# Patient Record
Sex: Female | Born: 1950 | Race: White | Hispanic: No | Smoking: Never smoker
Health system: Southern US, Community
[De-identification: ages and names within clinical notes are randomized; demographics above are authoritative.]

## PROBLEM LIST (undated history)

## (undated) DIAGNOSIS — M1990 Unspecified osteoarthritis, unspecified site: Secondary | ICD-10-CM

## (undated) DIAGNOSIS — I10 Essential (primary) hypertension: Secondary | ICD-10-CM

## (undated) DIAGNOSIS — K760 Fatty (change of) liver, not elsewhere classified: Secondary | ICD-10-CM

## (undated) DIAGNOSIS — R112 Nausea with vomiting, unspecified: Secondary | ICD-10-CM

## (undated) DIAGNOSIS — Z9889 Other specified postprocedural states: Secondary | ICD-10-CM

## (undated) DIAGNOSIS — E039 Hypothyroidism, unspecified: Secondary | ICD-10-CM

## (undated) DIAGNOSIS — R000 Tachycardia, unspecified: Secondary | ICD-10-CM

## (undated) DIAGNOSIS — E785 Hyperlipidemia, unspecified: Secondary | ICD-10-CM

## (undated) HISTORY — DX: Essential (primary) hypertension: I10

## (undated) HISTORY — DX: Fatty (change of) liver, not elsewhere classified: K76.0

## (undated) HISTORY — PX: ABDOMINAL HYSTERECTOMY: SHX81

## (undated) HISTORY — DX: Hyperlipidemia, unspecified: E78.5

## (undated) HISTORY — DX: Hypothyroidism, unspecified: E03.9

## (undated) HISTORY — PX: TUBAL LIGATION: SHX77

## (undated) HISTORY — DX: Tachycardia, unspecified: R00.0

## (undated) HISTORY — PX: KNEE ARTHROSCOPY: SUR90

## (undated) SURGERY — Surgical Case
Anesthesia: *Unknown

---

## 2004-10-23 ENCOUNTER — Ambulatory Visit: Payer: Self-pay | Admitting: Family Medicine

## 2005-10-29 ENCOUNTER — Ambulatory Visit: Payer: Self-pay | Admitting: Family Medicine

## 2006-11-04 ENCOUNTER — Ambulatory Visit: Payer: Self-pay | Admitting: Family Medicine

## 2007-11-10 ENCOUNTER — Ambulatory Visit: Payer: Self-pay | Admitting: Family Medicine

## 2008-11-10 ENCOUNTER — Ambulatory Visit: Payer: Self-pay | Admitting: Family Medicine

## 2009-12-05 ENCOUNTER — Ambulatory Visit: Payer: Self-pay | Admitting: Family Medicine

## 2010-12-16 ENCOUNTER — Ambulatory Visit: Payer: Self-pay | Admitting: Family Medicine

## 2012-03-15 ENCOUNTER — Ambulatory Visit: Payer: Self-pay | Admitting: Family Medicine

## 2013-04-19 ENCOUNTER — Ambulatory Visit: Payer: Self-pay | Admitting: Family Medicine

## 2013-05-17 ENCOUNTER — Ambulatory Visit: Payer: Self-pay | Admitting: Family Medicine

## 2013-05-31 ENCOUNTER — Ambulatory Visit: Payer: Self-pay | Admitting: General Surgery

## 2013-06-08 ENCOUNTER — Ambulatory Visit (INDEPENDENT_AMBULATORY_CARE_PROVIDER_SITE_OTHER): Payer: BC Managed Care – PPO | Admitting: General Surgery

## 2013-06-08 ENCOUNTER — Other Ambulatory Visit: Payer: BC Managed Care – PPO

## 2013-06-08 ENCOUNTER — Encounter: Payer: Self-pay | Admitting: General Surgery

## 2013-06-08 VITALS — BP 130/78 | HR 76 | Resp 12 | Ht 62.0 in | Wt 174.0 lb

## 2013-06-08 DIAGNOSIS — N63 Unspecified lump in unspecified breast: Secondary | ICD-10-CM

## 2013-06-08 HISTORY — PX: BREAST BIOPSY: SHX20

## 2013-06-08 NOTE — Patient Instructions (Signed)

## 2013-06-08 NOTE — Progress Notes (Signed)
Patient ID: Samantha SearingKaren P Sacco, female   DOB: 09/29/1950, 63 y.o.   MRN: [id number]  Chief Complaint  Patient presents with  . Breast Problem    abnormal mammogram    HPI Samantha Hamilton is a 63 y.o. female who presents for a breast evaluation. The most recent mammogram was done on 05/17/13. Patient does perform regular self breast checks and gets regular mammograms done.    HPI  Past Medical History  Diagnosis Date  . Hypertension   . Heart rate fast     Past Surgical History  Procedure Laterality Date  . Abdominal hysterectomy      History reviewed. No pertinent family history.  Social History History  Substance Use Topics  . Smoking status: Never Smoker   . Smokeless tobacco: Never Used  . Alcohol Use: No    No Known Allergies  Current Outpatient Prescriptions  Medication Sig Dispense Refill  . amLODipine-benazepril (LOTREL) 10-20 MG per capsule Take 1 capsule by mouth daily.      [name]. atenolol (TENORMIN) 100 MG tablet Take 100 mg by mouth daily.       No current facility-administered medications for this visit.    Review of Systems Review of Systems  Constitutional: Negative.   Respiratory: Negative.   Cardiovascular: Negative.     Blood pressure 130/78, pulse 76, resp. rate 12, height 5\' 2"  (1.575 m), weight 174 lb (78.926 kg).  Physical Exam Physical Exam  Constitutional: She is oriented to person, place, and time. She appears well-developed and well-nourished.  Eyes: Conjunctivae are normal.  Cardiovascular: Normal rate, regular rhythm and normal heart sounds.   Pulmonary/Chest: Breath sounds normal. Right breast exhibits no inverted nipple, no mass, no nipple discharge, no skin change and no tenderness. Left breast exhibits no inverted nipple, no mass, no nipple discharge, no skin change and no tenderness.  Abdominal: Soft. Bowel sounds are normal. There is no tenderness.  Lymphadenopathy:    She has no cervical adenopathy.    She has no axillary  adenopathy.  Neurological: She is alert and oriented to person, place, and time.  Skin: Skin is warm and dry.    Data Reviewed Mammogram and US  Reviewed.  Assessment    Small benign appearing mass in right breast 3ocll subareolar. Pt is inclined to have it biopsied. Core biopsy explained to her.    Plan    Core biopsy completed today. If benign will recheck in 4 mos with US.       Alaa Eyerman G Ledon Weihe 06/08/2013, 1:11 PM

## 2013-06-13 LAB — PATHOLOGY

## 2013-06-14 ENCOUNTER — Telehealth: Payer: Self-pay | Admitting: *Deleted

## 2013-06-14 NOTE — Telephone Encounter (Signed)
Notified patient as instructed, patient pleased. Discussed follow-up appointments, patient agrees. Placed in recalls.  

## 2013-06-14 NOTE — Telephone Encounter (Signed)
Pt called and just wanted to know what her results are from her BX that she had done on 06/08/13

## 2013-06-14 NOTE — Telephone Encounter (Signed)
Message copied by Currie ParisHATCH, Lashika Erker M on Tue Jun 14, 2013  1:20 PM ------      Message from: Kieth BrightlySANKAR, SEEPLAPUTHUR G      Created: Tue Jun 14, 2013 10:51 AM       Please let pt pt know the pathology was normal. F/U in 4 mos as discussed ------

## 2013-10-17 ENCOUNTER — Ambulatory Visit: Payer: Self-pay | Admitting: General Surgery

## 2013-10-17 ENCOUNTER — Other Ambulatory Visit: Payer: No Typology Code available for payment source

## 2013-10-17 ENCOUNTER — Encounter: Payer: Self-pay | Admitting: General Surgery

## 2013-10-17 ENCOUNTER — Ambulatory Visit (INDEPENDENT_AMBULATORY_CARE_PROVIDER_SITE_OTHER): Payer: Medicare Other | Admitting: General Surgery

## 2013-10-17 VITALS — BP 120/72 | HR 72 | Resp 14 | Ht 62.0 in | Wt 174.0 lb

## 2013-10-17 DIAGNOSIS — N6310 Unspecified lump in the right breast, unspecified quadrant: Secondary | ICD-10-CM

## 2013-10-17 DIAGNOSIS — N63 Unspecified lump in unspecified breast: Secondary | ICD-10-CM

## 2013-10-17 NOTE — Progress Notes (Signed)
Patient ID: [name], female   DOB: August 10, 1950, 63 y.o.   MRN: [id number]  Chief Complaint  Patient presents with  . Follow-up    rigth breast ultrasound    HPI Samantha Hamilton is a 63 y.o. female here today following up from a right breast core biopsy done 06/08/13. Patient states she is doing well with no new problems.  HPI  Past Medical History  Diagnosis Date  . Hypertension   . Heart rate fast     Past Surgical History  Procedure Laterality Date  . Abdominal hysterectomy    . Breast biopsy Right 06/08/13    History reviewed. No pertinent family history.  Social History History  Substance Use Topics  . Smoking status: Never Smoker   . Smokeless tobacco: Never Used  . Alcohol Use: No    No Known Allergies  Current Outpatient Prescriptions  Medication Sig Dispense Refill  . amLODipine-benazepril (LOTREL) 10-20 MG per capsule Take 1 capsule by mouth daily.      [name] atenolol (TENORMIN) 100 MG tablet Take 100 mg by mouth daily.      . Biotin (SUPER BIOTIN) 5 MG TABS Take by mouth.      . diclofenac (VOLTAREN) 75 MG EC tablet Take by mouth.       No current facility-administered medications for this visit.    Review of Systems Review of Systems  Constitutional: Negative.   Respiratory: Negative.   Cardiovascular: Negative.     Blood pressure 120/72, pulse 72, resp. rate 14, height  (1.575 m), weight 174 lb (78.926 kg).  Physical Exam Physical Exam  Constitutional: She is oriented to person, place, and time. She appears well-developed and well-nourished.  Pulmonary/Chest: Right breast exhibits no inverted nipple, no mass, no nipple discharge, no skin change and no tenderness.  Neurological: She is alert and oriented to person, place, and time.  Skin: Skin is warm and dry.    Data Reviewed Path-showed apocrine cysts  Assessment    Right breat ultrasound performed- few tiny cysts and some mildly dilated ducts at site of prio  biopsy medial to nipple       Plan    Patient to return in four months with bilateral screening mammogram         Samantha Hamilton 10/18/2013, 5:08 PM

## 2013-10-17 NOTE — Patient Instructions (Addendum)
Patient to return in four months with bilateral screening mammogram . Continue self breast exams. Call office for any new breast issues or concerns.

## 2013-10-18 ENCOUNTER — Encounter: Payer: Self-pay | Admitting: General Surgery

## 2013-10-27 ENCOUNTER — Ambulatory Visit: Payer: Self-pay | Admitting: Orthopedic Surgery

## 2013-12-05 ENCOUNTER — Encounter: Payer: Self-pay | Admitting: General Surgery

## 2013-12-09 ENCOUNTER — Ambulatory Visit: Payer: Self-pay | Admitting: Unknown Physician Specialty

## 2013-12-23 ENCOUNTER — Ambulatory Visit: Payer: Self-pay | Admitting: Unknown Physician Specialty

## 2014-03-22 ENCOUNTER — Telehealth: Payer: Self-pay

## 2014-03-22 NOTE — Telephone Encounter (Signed)
LMOM for a return call.  

## 2014-03-22 NOTE — Telephone Encounter (Signed)
Pt called to set up her first tcs. She states she is not having any GI problems and is new to the area and we were recommended to her. Please call her at [phone]

## 2014-03-23 ENCOUNTER — Telehealth: Payer: Self-pay

## 2014-03-23 ENCOUNTER — Other Ambulatory Visit: Payer: Self-pay

## 2014-03-23 DIAGNOSIS — Z1211 Encounter for screening for malignant neoplasm of colon: Secondary | ICD-10-CM

## 2014-03-23 NOTE — Telephone Encounter (Signed)
Gastroenterology Pre-Procedure Review  Request Date: 03/23/2014 Requesting Physician:   PATIENT REVIEW QUESTIONS: The patient responded to the following health history questions as indicated:    This is first colonoscopy for pt. [facility]/ Recently has insurance  1. Diabetes Melitis: no 2. Joint replacements in the past 12 months: no 3. Major health problems in the past 3 months: no 4. Has an artificial valve or MVP: no 5. Has a defibrillator: no 6. Has been advised in past to take antibiotics in advance of a procedure like teeth cleaning: no    MEDICATIONS & ALLERGIES:    Patient reports the following regarding taking any blood thinners:   Plavix? no Aspirin? no Coumadin? no  Patient confirms/reports the following medications:  Current Outpatient Prescriptions  Medication Sig Dispense Refill  . amLODipine-benazepril (LOTREL) 10-20 MG per capsule Take 1 capsule by mouth daily.    [name]. atenolol (TENORMIN) 100 MG tablet Take 100 mg by mouth daily.    . Biotin (SUPER BIOTIN) 5 MG TABS Take by mouth.    . estradiol (ESTRACE) 0.5 MG tablet Take 0.5 mg by mouth daily.     No current facility-administered medications for this visit.    Patient confirms/reports the following allergies:  No Known Allergies  No orders of the defined types were placed in this encounter.    AUTHORIZATION INFORMATION Primary Insurance:   ID #:   Group #:  Pre-Cert / Auth required:  Pre-Cert / Auth #:   Secondary Insurance:   ID #:   Group #:  Pre-Cert / Auth required:  Pre-Cert / Auth #:   SCHEDULE INFORMATION: Procedure has been scheduled as follows:  Date:   04/14/2014            Time:  11:30 AM Location: [facility] Short Stay  This Gastroenterology Pre-Precedure Review Form is being routed to the following provider(s): R. Roetta SessionsMichael Rourk, MD

## 2014-03-23 NOTE — Telephone Encounter (Signed)
See separate triage.  

## 2014-03-27 NOTE — Telephone Encounter (Signed)
Appropriate.

## 2014-03-29 ENCOUNTER — Telehealth: Payer: Self-pay

## 2014-03-29 MED ORDER — PEG-KCL-NACL-NASULF-NA ASC-C 100 G PO SOLR: 1.0000 | ORAL | Status: DC

## 2014-03-29 NOTE — Telephone Encounter (Signed)
See separate triage.  

## 2014-03-29 NOTE — Telephone Encounter (Signed)
Rx sent to the pharmacy and instructions mailed to pt.  

## 2014-04-10 ENCOUNTER — Telehealth: Payer: Self-pay

## 2014-04-10 NOTE — Telephone Encounter (Signed)
LMOM to call to update meds prior to procedure on 04/14/2014.

## 2014-04-10 NOTE — Telephone Encounter (Signed)
I called [employer] at [phone] and a PA is not required for colonoscopy.

## 2014-04-12 NOTE — Telephone Encounter (Signed)
Pt left VM that she has not had any change in her meds since she was triaged.

## 2014-04-12 NOTE — Telephone Encounter (Signed)
Noted  

## 2014-04-13 ENCOUNTER — Other Ambulatory Visit: Payer: Self-pay

## 2014-04-13 DIAGNOSIS — Z1211 Encounter for screening for malignant neoplasm of colon: Secondary | ICD-10-CM

## 2014-04-14 ENCOUNTER — Encounter (HOSPITAL_COMMUNITY)
Admission: RE | Disposition: A | Discharge status: Home / Self Care | Payer: Self-pay | Source: Ambulatory Visit | Attending: Internal Medicine

## 2014-04-14 ENCOUNTER — Ambulatory Visit (HOSPITAL_COMMUNITY): Admission: RE | Admit: 2014-04-14 | Payer: Medicare Other | Source: Ambulatory Visit | Admitting: Internal Medicine

## 2014-04-14 ENCOUNTER — Encounter (HOSPITAL_COMMUNITY): Admission: RE | Payer: Self-pay | Source: Ambulatory Visit

## 2014-04-14 ENCOUNTER — Ambulatory Visit (HOSPITAL_COMMUNITY)
Admission: RE | Admit: 2014-04-14 | Discharge: 2014-04-14 | Disposition: A | Discharge status: Home / Self Care | Payer: Medicare Other | Source: Ambulatory Visit | Attending: Internal Medicine | Admitting: Internal Medicine

## 2014-04-14 ENCOUNTER — Encounter (HOSPITAL_COMMUNITY): Payer: Self-pay | Admitting: *Deleted

## 2014-04-14 DIAGNOSIS — I10 Essential (primary) hypertension: Secondary | ICD-10-CM | POA: Diagnosis not present

## 2014-04-14 DIAGNOSIS — Z793 Long term (current) use of hormonal contraceptives: Secondary | ICD-10-CM | POA: Diagnosis not present

## 2014-04-14 DIAGNOSIS — Z1211 Encounter for screening for malignant neoplasm of colon: Secondary | ICD-10-CM | POA: Diagnosis present

## 2014-04-14 DIAGNOSIS — K5730 Diverticulosis of large intestine without perforation or abscess without bleeding: Secondary | ICD-10-CM | POA: Diagnosis not present

## 2014-04-14 DIAGNOSIS — Z79899 Other long term (current) drug therapy: Secondary | ICD-10-CM | POA: Diagnosis not present

## 2014-04-14 HISTORY — DX: Nausea with vomiting, unspecified: R11.2

## 2014-04-14 HISTORY — DX: Other specified postprocedural states: Z98.890

## 2014-04-14 HISTORY — PX: COLONOSCOPY: SHX5424

## 2014-04-14 HISTORY — DX: Nausea with vomiting, unspecified: Z98.890

## 2014-04-14 SURGERY — COLONOSCOPY
Anesthesia: Moderate Sedation

## 2014-04-14 MED ORDER — SODIUM CHLORIDE 0.9 % IV SOLN
INTRAVENOUS | Status: DC
  Administered 2014-04-14: 11:00:00 via INTRAVENOUS

## 2014-04-14 MED ORDER — SIMETHICONE 40 MG/0.6ML PO SUSP
ORAL | Status: DC | PRN
  Administered 2014-04-14: 12:00:00

## 2014-04-14 MED ORDER — MEPERIDINE HCL 100 MG/ML IJ SOLN
INTRAMUSCULAR | Status: DC | PRN
  Administered 2014-04-14: 25 mg via INTRAVENOUS
  Administered 2014-04-14: 50 mg via INTRAVENOUS

## 2014-04-14 MED ORDER — MEPERIDINE HCL 100 MG/ML IJ SOLN
INTRAMUSCULAR | Status: AC
  Filled 2014-04-14: qty 2

## 2014-04-14 MED ORDER — MIDAZOLAM HCL 5 MG/5ML IJ SOLN
INTRAMUSCULAR | Status: DC | PRN
  Administered 2014-04-14: 2 mg via INTRAVENOUS
  Administered 2014-04-14: 1 mg via INTRAVENOUS

## 2014-04-14 MED ORDER — ONDANSETRON HCL 4 MG/2ML IJ SOLN
INTRAMUSCULAR | Status: DC | PRN
  Administered 2014-04-14: 4 mg via INTRAVENOUS

## 2014-04-14 MED ORDER — MIDAZOLAM HCL 5 MG/5ML IJ SOLN
INTRAMUSCULAR | Status: AC
  Filled 2014-04-14: qty 10

## 2014-04-14 MED ORDER — ONDANSETRON HCL 4 MG/2ML IJ SOLN
INTRAMUSCULAR | Status: AC
  Filled 2014-04-14: qty 2

## 2014-04-14 NOTE — H&P (Signed)
@LOGO@   Primary Care Physician:  HEDRICK, JAMES, MD Primary Gastroenterologist:  Dr. Rourk  Pre-Procedure History & Physical: HPI:  Samantha Hamilton is a 63 y.o. female is here for a screening colonoscopy. No bowel symptoms. No prior colonoscopy. No family history of colon cancer.  Past Medical History  Diagnosis Date  . Hypertension   . Heart rate fast   . PONV (postoperative nausea and vomiting)     Past Surgical History  Procedure Laterality Date  . Abdominal hysterectomy    . Breast biopsy Right 06/08/13  . Knee arthroscopy Right     Prior to Admission medications   Medication Sig Start Date End Date Taking? Authorizing Provider  amLODipine-benazepril (LOTREL) 10-20 MG per capsule Take 1 capsule by mouth daily. 05/16/13  Yes Historical Provider, MD  atenolol (TENORMIN) 100 MG tablet Take 100 mg by mouth daily.   Yes Historical Provider, MD  Biotin 1 MG CAPS Take 1 mg by mouth daily.   Yes Historical Provider, MD  calcium-vitamin D (OSCAL WITH D) 500-200 MG-UNIT per tablet Take 1 tablet by mouth 2 (two) times daily.   Yes Historical Provider, MD  estradiol (ESTRACE) 0.5 MG tablet Take 0.5 mg by mouth daily.   Yes Historical Provider, MD  peg 3350 powder (MOVIPREP) 100 G SOLR Take 1 kit (200 g total) by mouth as directed. 03/29/14  Yes Robert M Rourk, MD    Allergies as of 04/13/2014  . (No Known Allergies)    History reviewed. No pertinent family history.  History   Social History  . Marital Status: Divorced    Spouse Name: N/A  . Number of Children: N/A  . Years of Education: N/A   Occupational History  . Not on file.   Social History Main Topics  . Smoking status: Never Smoker   . Smokeless tobacco: Never Used  . Alcohol Use: No  . Drug Use: No  . Sexual Activity: Not on file   Other Topics Concern  . Not on file   Social History Narrative    Review of Systems: See HPI, otherwise negative ROS  Physical Exam: BP 136/70 mmHg  Pulse 62  Temp(Src)  97.7 F (36.5 C) (Oral)  Resp 18  SpO2 96% General:   Alert,  Well-developed, well-nourished, pleasant and cooperative in NAD Head:  Normocephalic and atraumatic. Eyes:  Sclera clear, no icterus.   Conjunctiva pink. Ears:  Normal auditory acuity. Nose:  No deformity, discharge,  or lesions. Mouth:  No deformity or lesions, dentition normal. Neck:  Supple; no masses or thyromegaly. Lungs:  Clear throughout to auscultation.   No wheezes, crackles, or rhonchi. No acute distress. Heart:  Regular rate and rhythm; no murmurs, clicks, rubs,  or gallops. Abdomen:  Soft, nontender and nondistended. No masses, hepatosplenomegaly or hernias noted. Normal bowel sounds, without guarding, and without rebound.   Msk:  Symmetrical without gross deformities. Normal posture. Pulses:  Normal pulses noted. Extremities:  Without clubbing or edema. Neurologic:  Alert and  oriented x4;  grossly normal neurologically. Skin:  Intact without significant lesions or rashes. Cervical Nodes:  No significant cervical adenopathy. Psych:  Alert and cooperative. Normal mood and affect.  Impression/Plan: Samantha Hamilton is now here to undergo a screening colonoscopy.  First ever average risk screening examination  Risks, benefits, limitations, imponderables and alternatives regarding colonoscopy have been reviewed with the patient. Questions have been answered. All parties agreeable.     Notice:  This dictation was prepared with Dragon dictation   along with smaller phrase technology. Any transcriptional errors that result from this process are unintentional and may not be corrected upon review.

## 2014-04-14 NOTE — Op Note (Signed)
[facility] [address] [place], [id number]   COLONOSCOPY PROCEDURE REPORT  PATIENT: Samantha Hamilton, [name]  MR#: [id number] BIRTHDATE: 10-01-1950 , 63  yrs. old GENDER: female ENDOSCOPIST: R.  Roetta SessionsMichael Hazel Leveille, MD FACP [facility] REFERRED AO:ZHYQMBY:James Hedrick, M.D. PROCEDURE DATE:  04/14/2014 PROCEDURE:   Colonoscopy, screening INDICATIONS:First-ever average risk colorectal cancer screening examination. MEDICATIONS: Versed 5 mg IV and Demerol 100 mg IV in divided doses. Zofran 4 mg IV. ASA CLASS:       Class II  CONSENT: The risks, benefits, alternatives and imponderables including but not limited to bleeding, perforation as well as the possibility of a missed lesion have been reviewed.  The potential for biopsy, lesion removal, etc. have also been discussed. Questions have been answered.  All parties agreeable.  Please see the history and physical in the medical record for more information.  DESCRIPTION OF PROCEDURE:   After the risks benefits and alternatives of the procedure were thoroughly explained, informed consent was obtained.  The digital rectal exam revealed no abnormalities of the rectum.   The EC-3890Li ([id number]  endoscope was introduced through the anus and advanced to the terminal ileum which was intubated for a short distance. No adverse events experienced.   The quality of the prep was adequate  Rectum was small. Unable to retroflex. The rectal mucosa, however, was seen very well on?"face.The instrument was then slowly withdrawn as the colon was fully examined.      COLON FINDINGS: Normal-appearing rectal mucosa.  Scattered left-sided diverticula; the remainder of the colonic mucosa appeared normal.  The distal 5 cm of terminal ileal mucosa also appeared normal.  Retroflexion was not performed. .  Withdrawal time=7 minutes 0 seconds.  The scope was withdrawn and the procedure completed. COMPLICATIONS: There were no immediate  complications.  ENDOSCOPIC IMPRESSION: Colonic diverticulosis.  RECOMMENDATIONS: Repeat screening colonoscopy in 10 years  eSigned:  R. Roetta SessionsMichael Tayron Hunnell, MD Jerrel IvoryFACP [facility] 04/14/2014 12:24 PM   cc:  CPT CODES: ICD CODES:  The ICD and CPT codes recommended by this software are interpretations from the data that the clinical staff has captured with the software.  The verification of the translation of this report to the ICD and CPT codes and modifiers is the sole responsibility of the health care institution and practicing physician where this report was generated.  [employer] will not be held responsible for the validity of the ICD and CPT codes included on this report.  AMA assumes no liability for data contained or not contained herein. CPT is a Publishing rights managerregistered trademark of the [employer].

## 2014-04-14 NOTE — Discharge Instructions (Signed)
Colonoscopy Discharge Instructions  Read the instructions outlined below and refer to this sheet in the next few weeks. These discharge instructions provide you with general information on caring for yourself after you leave the hospital. Your doctor may also give you specific instructions. While your treatment has been planned according to the most current medical practices available, unavoidable complications occasionally occur. If you have any problems or questions after discharge, call Dr. Jena Gauss at [phone]. ACTIVITY  You may resume your regular activity, but move at a slower pace for the next 24 hours.   Take frequent rest periods for the next 24 hours.   Walking will help get rid of the air and reduce the bloated feeling in your belly (abdomen).   No driving for 24 hours (because of the medicine (anesthesia) used during the test).    Do not sign any important legal documents or operate any machinery for 24 hours (because of the anesthesia used during the test).  NUTRITION  Drink plenty of fluids.   You may resume your normal diet as instructed by your doctor.   Begin with a light meal and progress to your normal diet. Heavy or fried foods are harder to digest and may make you feel sick to your stomach (nauseated).   Avoid alcoholic beverages for 24 hours or as instructed.  MEDICATIONS  You may resume your normal medications unless your doctor tells you otherwise.  WHAT YOU CAN EXPECT TODAY  Some feelings of bloating in the abdomen.   Passage of more gas than usual.   Spotting of blood in your stool or on the toilet paper.  IF YOU HAD POLYPS REMOVED DURING THE COLONOSCOPY:  No aspirin products for 7 days or as instructed.   No alcohol for 7 days or as instructed.   Eat a soft diet for the next 24 hours.  FINDING OUT THE RESULTS OF YOUR TEST Not all test results are available during your visit. If your test results are not back during the visit, make an appointment  with your caregiver to find out the results. Do not assume everything is normal if you have not heard from your caregiver or the medical facility. It is important for you to follow up on all of your test results.  SEEK IMMEDIATE MEDICAL ATTENTION IF:  You have more than a spotting of blood in your stool.   Your belly is swollen (abdominal distention).   You are nauseated or vomiting.   You have a temperature over 101.   You have abdominal pain or discomfort that is severe or gets worse throughout the day.   Diverticulosis information  Repeat colonoscopy in 10 years for screening purposes  Diverticulosis Diverticulosis is the condition that develops when small pouches (diverticula) form in the wall of your colon. Your colon, or large intestine, is where water is absorbed and stool is formed. The pouches form when the inside layer of your colon pushes through weak spots in the outer layers of your colon. CAUSES  No one knows exactly what causes diverticulosis. RISK FACTORS  Being older than 50. Your risk for this condition increases with age. Diverticulosis is rare in people younger than 40 years. By age 65, almost everyone has it.  Eating a low-fiber diet.  Being frequently constipated.  Being overweight.  Not getting enough exercise.  Smoking.  Taking over-the-counter pain medicines, like aspirin and ibuprofen. SYMPTOMS  Most people with diverticulosis do not have symptoms. DIAGNOSIS  Because diverticulosis often has no symptoms,  health care providers often discover the condition during an exam for other colon problems. In many cases, a health care provider will diagnose diverticulosis while using a flexible scope to examine the colon (colonoscopy). TREATMENT  If you have never developed an infection related to diverticulosis, you may not need treatment. If you have had an infection before, treatment may include:  Eating more fruits, vegetables, and grains.  Taking a  fiber supplement.  Taking a live bacteria supplement (probiotic).  Taking medicine to relax your colon. HOME CARE INSTRUCTIONS   Drink at least 6-8 glasses of water each day to prevent constipation.  Try not to strain when you have a bowel movement.  Keep all follow-up appointments. If you have had an infection before:  Increase the fiber in your diet as directed by your health care provider or dietitian.  Take a dietary fiber supplement if your health care provider approves.  Only take medicines as directed by your health care provider. SEEK MEDICAL CARE IF:   You have abdominal pain.  You have bloating.  You have cramps.  You have not gone to the bathroom in 3 days. SEEK IMMEDIATE MEDICAL CARE IF:   Your pain gets worse.  Yourbloating becomes very bad.  You have a fever or chills, and your symptoms suddenly get worse.  You begin vomiting.  You have bowel movements that are bloody or black. MAKE SURE YOU:  Understand these instructions.  Will watch your condition.  Will get help right away if you are not doing well or get worse. Document Released: 10/18/2003 Document Revised: 01/25/2013 Document Reviewed: 12/15/2012 [facility] Patient Information 2015 [employer] [place]. This information is not intended to replace advice given to you by your health care provider. Make sure you discuss any questions you have with your health care provider.

## 2014-04-19 ENCOUNTER — Encounter (HOSPITAL_COMMUNITY): Payer: Self-pay | Admitting: Internal Medicine

## 2014-04-26 ENCOUNTER — Encounter: Payer: Self-pay | Admitting: General Surgery

## 2014-04-26 ENCOUNTER — Ambulatory Visit: Payer: Self-pay | Admitting: General Surgery

## 2014-05-02 ENCOUNTER — Ambulatory Visit: Payer: No Typology Code available for payment source | Admitting: General Surgery

## 2014-05-10 ENCOUNTER — Ambulatory Visit: Payer: Medicare Other | Admitting: General Surgery

## 2014-05-16 ENCOUNTER — Ambulatory Visit: Payer: Medicare Other | Admitting: General Surgery

## 2014-05-22 ENCOUNTER — Encounter: Payer: Self-pay | Admitting: General Surgery

## 2014-05-22 ENCOUNTER — Ambulatory Visit (INDEPENDENT_AMBULATORY_CARE_PROVIDER_SITE_OTHER): Payer: Medicare Other | Admitting: General Surgery

## 2014-05-22 VITALS — BP 136/72 | HR 74 | Resp 12 | Ht 62.0 in | Wt 174.0 lb

## 2014-05-22 DIAGNOSIS — N6001 Solitary cyst of right breast: Secondary | ICD-10-CM

## 2014-05-22 NOTE — Patient Instructions (Addendum)
Patient to return as needed. Continue self breast exams. Call office for any new breast issues or concerns.'  

## 2014-05-22 NOTE — Progress Notes (Signed)
Patient ID: Samantha Hamilton, female   DOB: Apr 13, 1950, 64 y.o.   MRN: [id number]  Chief Complaint  Patient presents with  . Follow-up    mammogram    HPI Samantha Hamilton is a 64 y.o. female who presents for a breast evaluation. The most recent mammogram was done on 04/26/14 Patient does perform regular self breast checks and gets regular mammograms done.    HPI  Past Medical History  Diagnosis Date  . Hypertension   . Heart rate fast   . PONV (postoperative nausea and vomiting)     Past Surgical History  Procedure Laterality Date  . Abdominal hysterectomy    . Breast biopsy Right 06/08/13  . Knee arthroscopy Right   . Colonoscopy N/A 04/14/2014    Procedure: COLONOSCOPY;  Surgeon: Daneil Dolin, MD;  Location: [facility];  Service: Endoscopy;  Laterality: N/A;  11:30 AM    No family history on file.  Social History History  Substance Use Topics  . Smoking status: Never Smoker   . Smokeless tobacco: Never Used  . Alcohol Use: No    No Known Allergies  Current Outpatient Prescriptions  Medication Sig Dispense Refill  . amLODipine-benazepril (LOTREL) 10-20 MG per capsule Take 1 capsule by mouth daily.    [name] atenolol (TENORMIN) 100 MG tablet Take 100 mg by mouth daily.    . Biotin 1 MG CAPS Take 1 mg by mouth daily.    . calcium-vitamin D (OSCAL WITH D) 500-200 MG-UNIT per tablet Take 1 tablet by mouth 2 (two) times daily.    [name] estradiol (ESTRACE) 0.5 MG tablet Take 0.5 mg by mouth daily.    . peg 3350 powder (MOVIPREP) 100 G SOLR Take 1 kit (200 g total) by mouth as directed. 1 kit 0   No current facility-administered medications for this visit.    Review of Systems Review of Systems  Constitutional: Negative.   Respiratory: Negative.   Cardiovascular: Negative.     Blood pressure 136/72, pulse 74, resp. rate 12, height 5' 2" (1.575 m), weight 174 lb (78.926 kg).  Physical Exam Physical Exam  Constitutional: She is oriented to person, place, and time. She  appears well-developed and well-nourished.  Eyes: Conjunctivae are normal. No scleral icterus.  Neck: Neck supple.  Cardiovascular: Normal rate, regular rhythm and normal heart sounds.   Pulmonary/Chest: Effort normal and breath sounds normal. Right breast exhibits no inverted nipple, no mass, no nipple discharge, no skin change and no tenderness. Left breast exhibits no inverted nipple, no mass, no nipple discharge, no skin change and no tenderness.  Abdominal: Soft. Bowel sounds are normal. There is no tenderness.  Lymphadenopathy:    She has no cervical adenopathy.    She has no axillary adenopathy.  Neurological: She is alert and oriented to person, place, and time.  Skin: Skin is warm and dry.    Data Reviewed Mammogram reviewed-category 1  Assessment    Stable exam. No new problems noted. Pior biopsy right breast showed apocrine cyst     Plan    Patient to return as needed.Patient may return to her PCP for her yearly breast checks and mammogram.      PCP:  Gaye Alken, Janett Billow 05/22/2014, 9:18 AM

## 2015-03-28 ENCOUNTER — Other Ambulatory Visit: Payer: Self-pay | Admitting: Family Medicine

## 2015-03-28 DIAGNOSIS — Z1231 Encounter for screening mammogram for malignant neoplasm of breast: Secondary | ICD-10-CM

## 2015-03-30 ENCOUNTER — Other Ambulatory Visit: Payer: Self-pay | Admitting: Orthopedic Surgery

## 2015-03-30 DIAGNOSIS — M25562 Pain in left knee: Secondary | ICD-10-CM

## 2015-03-30 DIAGNOSIS — M2392 Unspecified internal derangement of left knee: Secondary | ICD-10-CM

## 2015-03-30 DIAGNOSIS — M1712 Unilateral primary osteoarthritis, left knee: Secondary | ICD-10-CM

## 2015-04-19 ENCOUNTER — Ambulatory Visit
Admission: RE | Admit: 2015-04-19 | Discharge: 2015-04-19 | Disposition: A | Discharge status: Home / Self Care | Payer: Medicare Other | Source: Ambulatory Visit | Attending: Orthopedic Surgery | Admitting: Orthopedic Surgery

## 2015-04-19 DIAGNOSIS — M7122 Synovial cyst of popliteal space [Baker], left knee: Secondary | ICD-10-CM | POA: Insufficient documentation

## 2015-04-19 DIAGNOSIS — M1712 Unilateral primary osteoarthritis, left knee: Secondary | ICD-10-CM | POA: Insufficient documentation

## 2015-04-19 DIAGNOSIS — S83232A Complex tear of medial meniscus, current injury, left knee, initial encounter: Secondary | ICD-10-CM | POA: Insufficient documentation

## 2015-04-19 DIAGNOSIS — M2392 Unspecified internal derangement of left knee: Secondary | ICD-10-CM | POA: Diagnosis present

## 2015-04-19 DIAGNOSIS — M25562 Pain in left knee: Secondary | ICD-10-CM | POA: Diagnosis not present

## 2015-04-30 ENCOUNTER — Ambulatory Visit: Payer: No Typology Code available for payment source

## 2015-05-01 ENCOUNTER — Ambulatory Visit
Admission: RE | Admit: 2015-05-01 | Discharge: 2015-05-01 | Disposition: A | Discharge status: Home / Self Care | Payer: Medicare Other | Source: Ambulatory Visit | Attending: Family Medicine | Admitting: Family Medicine

## 2015-05-01 ENCOUNTER — Other Ambulatory Visit: Payer: Self-pay | Admitting: Family Medicine

## 2015-05-01 DIAGNOSIS — Z1231 Encounter for screening mammogram for malignant neoplasm of breast: Secondary | ICD-10-CM | POA: Diagnosis not present

## 2015-06-19 ENCOUNTER — Other Ambulatory Visit: Payer: No Typology Code available for payment source

## 2015-06-19 ENCOUNTER — Encounter: Payer: Self-pay | Admitting: *Deleted

## 2015-06-19 NOTE — Patient Instructions (Signed)
  Your procedure is scheduled on:06/27/15 Report to Day Surgery. [facility] To find out your arrival time please call [phone] between 1PM - 3PM on 523/17  Remember: Instructions that are not followed completely may result in serious medical risk, up to and including death, or upon the discretion of your surgeon and anesthesiologist your surgery may need to be rescheduled.    __X__ 1. Do not eat food or drink liquids after midnight. No gum chewing or hard candies.     __X__ 2. No Alcohol for 24 hours before or after surgery.   ____ 3. Bring all medications with you on the day of surgery if instructed.    __X__ 4. Notify your doctor if there is any change in your medical condition     (cold, fever, infections).     Do not wear jewelry, make-up, hairpins, clips or nail polish.  Do not wear lotions, powders, or perfumes. You may wear deodorant.  Do not shave 48 hours prior to surgery. Men may shave face and neck.  Do not bring valuables to the hospital.    [facility] belongings or valuables.               Contacts, dentures or bridgework may not be worn into surgery.  Leave your suitcase in the car. After surgery it may be brought to your room.  For patients admitted to the hospital, discharge time is determined by your                treatment team.   Patients discharged the day of surgery will not be allowed to drive home.   Please read over the following fact sheets that you were given:   Surgical Site Infection Prevention   ____ Take these medicines the morning of surgery with A SIP OF WATER:    1. NONE  2.   3.   4.  5.  6.  ____ Fleet Enema (as directed)   X___ Use CHG Soap as directed  ____ Use inhalers on the day of surgery  ____ Stop metformin 2 days prior to surgery    ____ Take 1/2 of usual insulin dose the night before surgery and none on the morning of surgery.   ____ Stop Coumadin/Plavix/aspirin on   ____  Stop Anti-inflammatories on    ____ Stop supplements until after surgery.    ____ Bring C-Pap to the hospital.

## 2015-06-20 ENCOUNTER — Other Ambulatory Visit: Payer: No Typology Code available for payment source

## 2015-06-21 ENCOUNTER — Encounter
Admission: RE | Admit: 2015-06-21 | Discharge: 2015-06-21 | Disposition: A | Discharge status: Home / Self Care | Payer: Medicare Other | Source: Ambulatory Visit | Attending: Unknown Physician Specialty | Admitting: Unknown Physician Specialty

## 2015-06-21 DIAGNOSIS — Z01810 Encounter for preprocedural cardiovascular examination: Secondary | ICD-10-CM | POA: Diagnosis not present

## 2015-06-27 ENCOUNTER — Ambulatory Visit
Admission: RE | Admit: 2015-06-27 | Discharge: 2015-06-27 | Disposition: A | Discharge status: Home / Self Care | Payer: Medicare Other | Source: Ambulatory Visit | Attending: Unknown Physician Specialty | Admitting: Unknown Physician Specialty

## 2015-06-27 ENCOUNTER — Ambulatory Visit: Payer: Medicare Other | Admitting: Anesthesiology

## 2015-06-27 ENCOUNTER — Encounter
Admission: RE | Disposition: A | Discharge status: Home / Self Care | Payer: Self-pay | Source: Ambulatory Visit | Attending: Unknown Physician Specialty

## 2015-06-27 DIAGNOSIS — Z8249 Family history of ischemic heart disease and other diseases of the circulatory system: Secondary | ICD-10-CM | POA: Diagnosis not present

## 2015-06-27 DIAGNOSIS — Z90710 Acquired absence of both cervix and uterus: Secondary | ICD-10-CM | POA: Insufficient documentation

## 2015-06-27 DIAGNOSIS — M25562 Pain in left knee: Secondary | ICD-10-CM | POA: Diagnosis present

## 2015-06-27 DIAGNOSIS — I10 Essential (primary) hypertension: Secondary | ICD-10-CM | POA: Insufficient documentation

## 2015-06-27 DIAGNOSIS — Z79899 Other long term (current) drug therapy: Secondary | ICD-10-CM | POA: Insufficient documentation

## 2015-06-27 DIAGNOSIS — M23222 Derangement of posterior horn of medial meniscus due to old tear or injury, left knee: Secondary | ICD-10-CM | POA: Diagnosis not present

## 2015-06-27 DIAGNOSIS — Z8041 Family history of malignant neoplasm of ovary: Secondary | ICD-10-CM | POA: Insufficient documentation

## 2015-06-27 DIAGNOSIS — Z833 Family history of diabetes mellitus: Secondary | ICD-10-CM | POA: Insufficient documentation

## 2015-06-27 HISTORY — DX: Unspecified osteoarthritis, unspecified site: M19.90

## 2015-06-27 HISTORY — PX: KNEE ARTHROSCOPY: SHX127

## 2015-06-27 SURGERY — ARTHROSCOPY, KNEE
Anesthesia: General | Laterality: Left | Wound class: Clean

## 2015-06-27 MED ORDER — FENTANYL CITRATE (PF) 100 MCG/2ML IJ SOLN
INTRAMUSCULAR | Status: DC | PRN
  Administered 2015-06-27: 50 ug via INTRAVENOUS
  Administered 2015-06-27 (×2): 25 ug via INTRAVENOUS

## 2015-06-27 MED ORDER — BUPIVACAINE HCL (PF) 0.5 % IJ SOLN
INTRAMUSCULAR | Status: DC | PRN
  Administered 2015-06-27: 15 mL

## 2015-06-27 MED ORDER — ONDANSETRON HCL 4 MG/2ML IJ SOLN
INTRAMUSCULAR | Status: AC
  Administered 2015-06-27: 4 mg via INTRAVENOUS
  Filled 2015-06-27: qty 2

## 2015-06-27 MED ORDER — FENTANYL CITRATE (PF) 100 MCG/2ML IJ SOLN
INTRAMUSCULAR | Status: AC
  Administered 2015-06-27: 50 ug via INTRAVENOUS
  Filled 2015-06-27: qty 2

## 2015-06-27 MED ORDER — CEFAZOLIN SODIUM-DEXTROSE 2-3 GM-% IV SOLR
INTRAVENOUS | Status: DC | PRN
  Administered 2015-06-27: 2 g via INTRAVENOUS

## 2015-06-27 MED ORDER — LIDOCAINE HCL (CARDIAC) 20 MG/ML IV SOLN
INTRAVENOUS | Status: DC | PRN
  Administered 2015-06-27: 30 mg via INTRAVENOUS

## 2015-06-27 MED ORDER — OXYCODONE HCL 5 MG PO TABS
ORAL_TABLET | ORAL | Status: AC
  Filled 2015-06-27: qty 1

## 2015-06-27 MED ORDER — FAMOTIDINE 20 MG PO TABS
20.0000 mg | ORAL_TABLET | Freq: Once | ORAL | Status: AC
  Administered 2015-06-27: 20 mg via ORAL

## 2015-06-27 MED ORDER — OXYCODONE HCL 5 MG PO TABS
5.0000 mg | ORAL_TABLET | Freq: Once | ORAL | Status: AC | PRN
  Administered 2015-06-27: 5 mg via ORAL

## 2015-06-27 MED ORDER — PROMETHAZINE HCL 25 MG PO TABS: 25.0000 mg | ORAL_TABLET | Freq: Once | ORAL | Status: DC

## 2015-06-27 MED ORDER — ONDANSETRON HCL 4 MG/2ML IJ SOLN
INTRAMUSCULAR | Status: DC | PRN
  Administered 2015-06-27: 4 mg via INTRAVENOUS

## 2015-06-27 MED ORDER — DEXAMETHASONE SODIUM PHOSPHATE 10 MG/ML IJ SOLN
INTRAMUSCULAR | Status: DC | PRN
  Administered 2015-06-27: 10 mg via INTRAVENOUS

## 2015-06-27 MED ORDER — OXYCODONE HCL 5 MG/5ML PO SOLN: 5.0000 mg | Freq: Once | ORAL | Status: AC | PRN

## 2015-06-27 MED ORDER — MIDAZOLAM HCL 2 MG/2ML IJ SOLN
INTRAMUSCULAR | Status: DC | PRN
  Administered 2015-06-27: 0.5 mg via INTRAVENOUS
  Administered 2015-06-27: 1 mg via INTRAVENOUS
  Administered 2015-06-27: 0.5 mg via INTRAVENOUS

## 2015-06-27 MED ORDER — PROPOFOL 10 MG/ML IV BOLUS
INTRAVENOUS | Status: DC | PRN
  Administered 2015-06-27: 150 mg via INTRAVENOUS

## 2015-06-27 MED ORDER — LACTATED RINGERS IV SOLN
INTRAVENOUS | Status: DC
  Administered 2015-06-27 (×2): via INTRAVENOUS

## 2015-06-27 MED ORDER — PROMETHAZINE HCL 25 MG PO TABS
ORAL_TABLET | ORAL | Status: AC
  Administered 2015-06-27: 25 mg
  Filled 2015-06-27: qty 1

## 2015-06-27 MED ORDER — FAMOTIDINE 20 MG PO TABS
ORAL_TABLET | ORAL | Status: AC
  Administered 2015-06-27: 20 mg via ORAL
  Filled 2015-06-27: qty 1

## 2015-06-27 MED ORDER — FENTANYL CITRATE (PF) 100 MCG/2ML IJ SOLN
25.0000 ug | INTRAMUSCULAR | Status: DC | PRN
  Administered 2015-06-27 (×2): 50 ug via INTRAVENOUS

## 2015-06-27 MED ORDER — NORCO 5-325 MG PO TABS: 1.0000 | ORAL_TABLET | Freq: Four times a day (QID) | ORAL | Status: DC | PRN

## 2015-06-27 MED ORDER — BUPIVACAINE HCL (PF) 0.5 % IJ SOLN
INTRAMUSCULAR | Status: AC
  Filled 2015-06-27: qty 30

## 2015-06-27 MED ORDER — ONDANSETRON HCL 4 MG/2ML IJ SOLN
4.0000 mg | Freq: Once | INTRAMUSCULAR | Status: AC
  Administered 2015-06-27: 4 mg via INTRAVENOUS

## 2015-06-27 SURGICAL SUPPLY — 40 items
ARTHROWAND PARAGON T2 (SURGICAL WAND)
BLADE ABRADER 4.5 (BLADE) ×3 IMPLANT
BLADE FULL RADIUS 3.5 (BLADE) ×3 IMPLANT
BLADE SHAVER 4.5X7 STR FR (MISCELLANEOUS) ×3 IMPLANT
BNDG ESMARK 6X12 TAN STRL LF (GAUZE/BANDAGES/DRESSINGS) ×3 IMPLANT
BUR ABRADER 4.0 W/FLUTE AQUA (MISCELLANEOUS) IMPLANT
BURR ABRADER 4.0 W/FLUTE AQUA (MISCELLANEOUS)
CHLORAPREP W/TINT 26ML (MISCELLANEOUS) ×3 IMPLANT
CUFF TOURN 24 STER (MISCELLANEOUS) ×3 IMPLANT
DRAPE LEGGINS SURG 28X43 STRL (DRAPES) ×3 IMPLANT
DRSG TEGADERM 2-3/8X2-3/4 SM (GAUZE/BANDAGES/DRESSINGS) ×3 IMPLANT
GAUZE SPONGE 4X4 12PLY STRL (GAUZE/BANDAGES/DRESSINGS) ×3 IMPLANT
GLOVE BIO SURGEON STRL SZ7.5 (GLOVE) ×3 IMPLANT
GLOVE BIO SURGEON STRL SZ8 (GLOVE) ×3 IMPLANT
GLOVE INDICATOR 8.0 STRL GRN (GLOVE) ×3 IMPLANT
GOWN STRL REUS W/ TWL LRG LVL3 (GOWN DISPOSABLE) ×1 IMPLANT
GOWN STRL REUS W/TWL LRG LVL3 (GOWN DISPOSABLE) ×2
GOWN STRL REUS W/TWL LRG LVL4 (GOWN DISPOSABLE) ×3 IMPLANT
IV LACTATED RINGER IRRG 3000ML (IV SOLUTION) ×4
IV LR IRRIG 3000ML ARTHROMATIC (IV SOLUTION) ×2 IMPLANT
KIT RM TURNOVER STRD PROC AR (KITS) ×3 IMPLANT
MANIFOLD 4PT FOR NEPTUNE1 (MISCELLANEOUS) ×3 IMPLANT
PACK ARTHROSCOPY KNEE (MISCELLANEOUS) ×3 IMPLANT
PADDING CAST 4IN STRL (MISCELLANEOUS)
PADDING CAST BLEND 4X4 STRL (MISCELLANEOUS) IMPLANT
SET TUBE SUCT SHAVER OUTFL 24K (TUBING) ×3 IMPLANT
SET TUBE TIP INTRA-ARTICULAR (MISCELLANEOUS) ×3 IMPLANT
SOL PREP PVP 2OZ (MISCELLANEOUS) ×3
SOLUTION PREP PVP 2OZ (MISCELLANEOUS) ×1 IMPLANT
SUT ETH BLK MONO 3 0 FS 1 12/B (SUTURE) ×3 IMPLANT
SUT ETHILON 3-0 FS-10 30 BLK (SUTURE) ×3
SUTURE EHLN 3-0 FS-10 30 BLK (SUTURE) ×1 IMPLANT
TAPE MICROFOAM 4IN (TAPE) ×3 IMPLANT
TUBING ARTHRO INFLOW-ONLY STRL (TUBING) ×3 IMPLANT
WAND 30 DEG SABER W/CORD (SURGICAL WAND) IMPLANT
WAND ARTHRO PARAGON T2 (SURGICAL WAND) IMPLANT
WAND COVAC 50 IFS (MISCELLANEOUS) IMPLANT
WAND HAND CNTRL MULTIVAC 50 (MISCELLANEOUS) ×3 IMPLANT
WAND HAND CNTRL MULTIVAC 90 (MISCELLANEOUS) IMPLANT
WRAP KNEE W/COLD PACKS 25.5X14 (SOFTGOODS) ×3 IMPLANT

## 2015-06-27 NOTE — Anesthesia Procedure Notes (Signed)
Procedure Name: LMA Insertion Date/Time: 06/27/2015 7:46 AM Performed by: Charna BusmanIAMOND, Samantha Hamilton Pre-anesthesia Checklist: Emergency Drugs available, Patient identified, Suction available, Patient being monitored and Timeout performed Patient Re-evaluated:Patient Re-evaluated prior to inductionOxygen Delivery Method: Circle system utilized Preoxygenation: Pre-oxygenation with 100% oxygen Intubation Type: Combination inhalational/ intravenous induction Ventilation: Mask ventilation without difficulty LMA: LMA inserted LMA Size: 3.0 Grade View: Grade III Tube type: Oral Number of attempts: 1 Placement Confirmation: positive ETCO2,  CO2 detector and breath sounds checked- equal and bilateral Tube secured with: Tape Dental Injury: Teeth and Oropharynx as per pre-operative assessment

## 2015-06-27 NOTE — OR Nursing (Signed)
Dr Gavin PottersKernodle in to see patient, patient fell last week and has nickel size area on left knee that is covered with a scab. No redness or drainage noted.  MD assessed no new orders at this time.

## 2015-06-27 NOTE — Op Note (Signed)
Patient: Samantha Hamilton  Preoperative diagnosis: Torn medial meniscus left knee along with some chondral thinning in the medial compartment and retropatellar surface  Postop diagnosis: Same  Operation: Arthroscopic partial medial meniscectomy plus chondral debridement  Surgeon: Randon GoldsmithKERNODLE,Tomio Kirk B, MD  Anesthesia: Gen.   History: Patient's had a long history of left knee pain.  The plain films revealed no significant medial or lateral compartment narrowing .  The patient had an MRI which revealed a torn medial meniscus plus medial compartment chondral thinning and some retropatellar chondral thinning..The patient was scheduled for surgery due to persistent discomfort despite conservative treatment.  The patient was taken the operating room where satisfactory general anesthesia was achieved. A tourniquet and leg holder were was applied to the left thigh. A well leg support was applied to the nonoperative extremity. The left knee was prepped and draped in usual fashion for an arthroscopic procedure. An inflow cannula was introduced superomedially. The joint was distended with lactated Ringer's. Scope was introduced through an inferolateral puncture wound and a probe through an inferomedial puncture wound. Inspection of the medial compartment revealed  a complex tear of the posterior horn of the medial meniscus. Some grade 2-3 chondral changes were noted in the posteromedial aspect of the medial femoral condyle and there was some fibrillation of the medial tibial plateau. I  resected the torn medial meniscus using a combination of basket biters and motorized resector. The remaining rim was contoured with an angled ArthroCare wand. I debrided the chondral changes with a turbowhisker. Inspection of the intercondylar notch revealed intact cruciates. Inspection of the the lateral compartment revealed no meniscal or chondral pathology.   Trochlear groove was inspected and appeared to be fairly  smooth.  Inspection of the retropatellar surface revealed grade 3 chondral changes in the apex of the patella. I used a turbo whisker to debride the chondral changes. I observed patella tracking from the superomedial portal. The patella seemed to track fairly well.  The instruments were removed from the joint at this time. The puncture wounds were closed with 3-0 nylon in vertical mattress fashion. I injected each puncture wound with several cc of half percent Marcaine without epinephrine. Betadine was applied the wounds followed by sterile dressing. An ice pack was applied to the right knee. The patient was awakened and transferred to the stretcher bed. The patient was taken to the recovery room in satisfactory condition.  The tourniquet was not inflated during the course of the procedure. Blood loss was negligible.

## 2015-06-27 NOTE — Transfer of Care (Signed)
Immediate Anesthesia Transfer of Care Note  Patient: Samantha Hamilton  Procedure(s) Performed: Procedure(s): ARTHROSCOPY KNEE, Partial Medial Menisectomy, Chondral Debridement  (Left)  Patient Location: PACU  Anesthesia Type:General  Level of Consciousness: awake, alert , oriented and patient cooperative  Airway & Oxygen Therapy: Patient Spontanous Breathing and Patient connected to face mask oxygen  Post-op Assessment: Report given to RN, Post -op Vital signs reviewed and stable and Patient moving all extremities X 4  Post vital signs: Reviewed and stable  Last Vitals:  Filed Vitals:   06/27/15 0611 06/27/15 0858  BP: 140/65 91/56  Pulse: 68 59  Temp: 36.2 C 36.4 C  Resp: 16 20    Last Pain:  Filed Vitals:   06/27/15 0859  PainSc: 5          Complications: No apparent anesthesia complications

## 2015-06-27 NOTE — Anesthesia Preprocedure Evaluation (Addendum)
Anesthesia Evaluation  Patient identified by MRN, date of birth, ID band Patient awake    Reviewed: Allergy & Precautions, H&P , NPO status , Patient's Chart, lab work & pertinent test results  History of Anesthesia Complications (+) PONV and history of anesthetic complications  Airway Mallampati: III  TM Distance: <3 FB Neck ROM: full    Dental  (+) Poor Dentition, Chipped, Caps   Pulmonary neg pulmonary ROS, neg shortness of breath,    Pulmonary exam normal breath sounds clear to auscultation       Cardiovascular Exercise Tolerance: Good hypertension, (-) angina(-) Past MI Normal cardiovascular exam+ dysrhythmias  Rhythm:regular Rate:Normal     Neuro/Psych negative neurological ROS  negative psych ROS   GI/Hepatic negative GI ROS, Neg liver ROS, neg GERD  ,  Endo/Other  negative endocrine ROS  Renal/GU negative Renal ROS  negative genitourinary   Musculoskeletal   Abdominal   Peds  Hematology negative hematology ROS (+)   Anesthesia Other Findings Past Medical History:   Hypertension                                                 Heart rate fast                                              Arthritis                                                    PONV (postoperative nausea and vomiting)                    Past Surgical History:   ABDOMINAL HYSTERECTOMY                                        KNEE ARTHROSCOPY                                Right              COLONOSCOPY                                     N/A 04/14/2014      Comment:Procedure: COLONOSCOPY;  Surgeon: Corbin Ade, MD;  [facility];  Service:               Endoscopy;  Laterality: N/A;  11:30 AM   BREAST BIOPSY                                   Right 06/08/13      BMI    Body Mass Index   31.63 kg/m 2      Reproductive/Obstetrics negative OB  ROS                            Anesthesia  Physical Anesthesia Plan  ASA: III  Anesthesia Plan: General LMA   Post-op Pain Management:    Induction:   Airway Management Planned:   Additional Equipment:   Intra-op Plan:   Post-operative Plan:   Informed Consent: I have reviewed the patients History and Physical, chart, labs and discussed the procedure including the risks, benefits and alternatives for the proposed anesthesia with the patient or authorized representative who has indicated his/her understanding and acceptance.   Dental Advisory Given  Plan Discussed with: Anesthesiologist, CRNA and Surgeon  Anesthesia Plan Comments:         Anesthesia Quick Evaluation

## 2015-06-27 NOTE — Anesthesia Postprocedure Evaluation (Signed)
Anesthesia Post Note  Patient: Samantha SearingKaren P Hamilton  Procedure(s) Performed: Procedure(s) (LRB): ARTHROSCOPY KNEE, Partial Medial Menisectomy, Chondral Debridement  (Left)  Patient location during evaluation: PACU Anesthesia Type: General Level of consciousness: awake and alert Pain management: pain level controlled Vital Signs Assessment: post-procedure vital signs reviewed and stable Respiratory status: spontaneous breathing, nonlabored ventilation, respiratory function stable and patient connected to nasal cannula oxygen Cardiovascular status: blood pressure returned to baseline and stable Postop Assessment: no signs of nausea or vomiting Anesthetic complications: no    Last Vitals:  Filed Vitals:   06/27/15 1035 06/27/15 1046  BP: 135/58 141/73  Pulse: 65 69  Temp:    Resp: 15 16    Last Pain:  Filed Vitals:   06/27/15 1048  PainSc: 5                  Joseph K Piscitello

## 2015-06-27 NOTE — H&P (Signed)
  H and P reviewed. No changes. Uploaded at later date. 

## 2015-06-27 NOTE — Discharge Instructions (Signed)
° °[facility]  °Harold B. [facility], Jr., M.D. [phone]  ° °KNEE ARTHROSCOPY POST OPERATION INSTRUCTIONS: ° °PLEASE READ THESE INSTRUCTIONS ABOUT POST OPERATION CARE. THEY WILL ANSWER MOST OF YOUR QUESTIONS.  °You have been given a prescription for pain. Please take as directed for pain.  °You can walk, keeping the knee slightly stiff-avoid doing too much bending the first day. (if ACL reconstruction is performed, keep brace locked in extension when walking.)  °You will use crutches or cane if needed. Can weight bear as tolerated  °Plan to take three to four days off from work. You can resume work when you are comfortable. (This can be a week or more, depending on the type of work you do.)  °To reduce pain and swelling, place one to two pillows under the knee the first two or three days when sitting or lying. An ice pack may be placed on top of the area over the dressing. Instructions for making homemade icepack are as follow:  °Flexible homemade alcohol water ice pack  °2 cups water  °1 cup rubbing alcohol  °food coloring for the blue tint (optional)  °2 zip-top bags - gallon-size  °Mix the water and alcohol together in one of your zip-top bags and add food coloring. Release as much air as possible and seal the bag. Place in freezer for at least 12 hours.  °The small incisions in your knee are closed with nylon stitches. They will be removed in the office.  °The bulky dressing may be removed in the third day after surgery. (If ACL surgery-DO NOT REMOVE BANDAGES). Put a waterproof band-aid over each stitch. Do not put any creams or ointments on wounds. You may shower at this time, but change waterproof band-aids after showering. KEEP INCISIONS CLEAN AND DRY UNTIL YOU RETURN TO THE OFFICE.  °Sometimes the operative area remains somewhat painful and swollen for several weeks. This is usually nothing to worry about, but call if you have any excessive symptoms, especially  fever. It is not unusual to have a low grade fever of 99 degrees for the first few days. If persist after 3-4 days call the office. It is not uncommon for the pain to be a little worse on the third day after surgery.  °Begin doing gentle exercises right away. They will be limited by the amount of pain and swelling you have.  Exercising will reduce the swelling, increase motion, and prevent muscle weakness. Exercises: Straight leg raising and gentle knee bending.  °Take 81 milligram aspirin twice a day for 2 weeks after meals or milk. This along with elevation will help reduce the possibility of phlebitis in your operated leg.  °Avoid strenuous athletics for a minimum of 4 to 6 weeks after arthroscopic surgery (approximately five months if ACL surgery).  °If the surgery included ACL reconstruction the brace that is supplied to the extremity post surgery is to be locked in extension when you are asleep and is to be locked in extension when you are ambulating. It can be unlocked for exercises or sitting.  °Keep your post surgery appointment that has been made for you. If you do not remember the date call [phone]. Your follow up appointment should be between 7-10 days.  ° ° °AMBULATORY SURGERY  °DISCHARGE INSTRUCTIONS ° ° °1) The drugs that you were given will stay in your system until tomorrow so for the next 24 hours you should not: ° °A) Drive an automobile °B) Make any legal   decisions °C) Drink any alcoholic beverage ° ° °2) You may resume regular meals tomorrow.  Today it is better to start with liquids and gradually work up to solid foods. ° °You may eat anything you prefer, but it is better to start with liquids, then soup and crackers, and gradually work up to solid foods. ° ° °3) Please notify your doctor immediately if you have any unusual bleeding, trouble breathing, redness and pain at the surgery site, drainage, fever, or pain not relieved by medication. ° ° ° °4) Additional  Instructions: ° ° ° ° ° ° ° °Please contact your physician with any problems or Same Day Surgery at [phone], Monday through Friday 6 am to 4 pm, or [place] [facility] [place] [facility] number at [phone]. °

## 2016-06-09 ENCOUNTER — Other Ambulatory Visit: Payer: Self-pay | Admitting: Family Medicine

## 2016-06-09 DIAGNOSIS — Z1231 Encounter for screening mammogram for malignant neoplasm of breast: Secondary | ICD-10-CM

## 2016-06-25 ENCOUNTER — Ambulatory Visit
Admission: RE | Admit: 2016-06-25 | Discharge: 2016-06-25 | Disposition: A | Discharge status: Home / Self Care | Payer: Medicare Other | Source: Ambulatory Visit | Attending: Family Medicine | Admitting: Family Medicine

## 2016-06-25 DIAGNOSIS — Z1231 Encounter for screening mammogram for malignant neoplasm of breast: Secondary | ICD-10-CM | POA: Diagnosis present

## 2017-04-16 ENCOUNTER — Other Ambulatory Visit: Payer: Self-pay | Admitting: Family Medicine

## 2017-04-16 DIAGNOSIS — Z1231 Encounter for screening mammogram for malignant neoplasm of breast: Secondary | ICD-10-CM

## 2017-06-30 ENCOUNTER — Ambulatory Visit
Admission: RE | Admit: 2017-06-30 | Discharge: 2017-06-30 | Disposition: A | Discharge status: Home / Self Care | Payer: Medicare Other | Source: Ambulatory Visit | Attending: Family Medicine | Admitting: Family Medicine

## 2017-06-30 DIAGNOSIS — Z1231 Encounter for screening mammogram for malignant neoplasm of breast: Secondary | ICD-10-CM | POA: Insufficient documentation

## 2019-04-20 ENCOUNTER — Other Ambulatory Visit: Payer: Self-pay | Admitting: Family Medicine

## 2019-04-20 DIAGNOSIS — Z1231 Encounter for screening mammogram for malignant neoplasm of breast: Secondary | ICD-10-CM

## 2019-05-17 ENCOUNTER — Ambulatory Visit
Admission: RE | Admit: 2019-05-17 | Discharge: 2019-05-17 | Disposition: A | Discharge status: Home / Self Care | Payer: Medicare Other | Source: Ambulatory Visit | Attending: Family Medicine | Admitting: Family Medicine

## 2019-05-17 DIAGNOSIS — Z1231 Encounter for screening mammogram for malignant neoplasm of breast: Secondary | ICD-10-CM | POA: Diagnosis present

## 2020-03-12 ENCOUNTER — Other Ambulatory Visit: Payer: Self-pay | Admitting: Family Medicine

## 2020-03-12 DIAGNOSIS — Z1231 Encounter for screening mammogram for malignant neoplasm of breast: Secondary | ICD-10-CM

## 2020-06-05 ENCOUNTER — Ambulatory Visit
Admission: RE | Admit: 2020-06-05 | Discharge: 2020-06-05 | Disposition: A | Discharge status: Home / Self Care | Payer: Medicare Other | Source: Ambulatory Visit | Attending: Family Medicine | Admitting: Family Medicine

## 2020-06-05 ENCOUNTER — Other Ambulatory Visit: Payer: Self-pay

## 2020-06-05 DIAGNOSIS — Z1231 Encounter for screening mammogram for malignant neoplasm of breast: Secondary | ICD-10-CM | POA: Diagnosis not present

## 2021-05-16 ENCOUNTER — Other Ambulatory Visit: Payer: Self-pay | Admitting: Family Medicine

## 2021-05-16 DIAGNOSIS — Z1231 Encounter for screening mammogram for malignant neoplasm of breast: Secondary | ICD-10-CM

## 2021-06-20 ENCOUNTER — Ambulatory Visit
Admission: RE | Admit: 2021-06-20 | Discharge: 2021-06-20 | Disposition: A | Discharge status: Home / Self Care | Payer: Medicare Other | Source: Ambulatory Visit | Attending: Family Medicine | Admitting: Family Medicine

## 2021-06-20 DIAGNOSIS — Z1231 Encounter for screening mammogram for malignant neoplasm of breast: Secondary | ICD-10-CM | POA: Insufficient documentation

## 2022-06-09 ENCOUNTER — Other Ambulatory Visit: Payer: Self-pay | Admitting: Family Medicine

## 2022-06-09 DIAGNOSIS — Z1231 Encounter for screening mammogram for malignant neoplasm of breast: Secondary | ICD-10-CM

## 2022-07-01 ENCOUNTER — Ambulatory Visit
Admission: RE | Admit: 2022-07-01 | Discharge: 2022-07-01 | Disposition: A | Discharge status: Home / Self Care | Payer: Medicare HMO | Source: Ambulatory Visit | Attending: Family Medicine | Admitting: Family Medicine

## 2022-07-01 DIAGNOSIS — Z1231 Encounter for screening mammogram for malignant neoplasm of breast: Secondary | ICD-10-CM | POA: Insufficient documentation

## 2022-07-07 ENCOUNTER — Other Ambulatory Visit: Payer: Self-pay | Admitting: Family Medicine

## 2022-07-07 DIAGNOSIS — R921 Mammographic calcification found on diagnostic imaging of breast: Secondary | ICD-10-CM

## 2022-07-07 DIAGNOSIS — R928 Other abnormal and inconclusive findings on diagnostic imaging of breast: Secondary | ICD-10-CM

## 2022-08-06 ENCOUNTER — Ambulatory Visit
Admission: RE | Admit: 2022-08-06 | Discharge: 2022-08-06 | Disposition: A | Discharge status: Home / Self Care | Payer: Medicare HMO | Source: Ambulatory Visit | Attending: Family Medicine | Admitting: Family Medicine

## 2022-08-06 DIAGNOSIS — R921 Mammographic calcification found on diagnostic imaging of breast: Secondary | ICD-10-CM | POA: Diagnosis present

## 2022-08-06 DIAGNOSIS — R928 Other abnormal and inconclusive findings on diagnostic imaging of breast: Secondary | ICD-10-CM | POA: Insufficient documentation

## 2022-09-03 ENCOUNTER — Other Ambulatory Visit: Payer: Self-pay | Admitting: Family Medicine

## 2022-09-03 DIAGNOSIS — E785 Hyperlipidemia, unspecified: Secondary | ICD-10-CM

## 2022-09-04 ENCOUNTER — Ambulatory Visit
Admission: RE | Admit: 2022-09-04 | Discharge: 2022-09-04 | Disposition: A | Discharge status: Home / Self Care | Payer: Medicare HMO | Source: Ambulatory Visit | Attending: Family Medicine | Admitting: Family Medicine

## 2022-09-04 DIAGNOSIS — E785 Hyperlipidemia, unspecified: Secondary | ICD-10-CM

## 2023-02-25 ENCOUNTER — Telehealth: Payer: Self-pay | Admitting: *Deleted

## 2023-02-25 ENCOUNTER — Encounter: Payer: Self-pay | Admitting: *Deleted

## 2023-02-25 ENCOUNTER — Ambulatory Visit (INDEPENDENT_AMBULATORY_CARE_PROVIDER_SITE_OTHER): Payer: Medicare HMO | Admitting: Gastroenterology

## 2023-02-25 ENCOUNTER — Encounter: Payer: Self-pay | Admitting: Gastroenterology

## 2023-02-25 VITALS — BP 112/64 | HR 71 | Temp 98.9°F | Ht 62.0 in | Wt 162.8 lb

## 2023-02-25 DIAGNOSIS — R197 Diarrhea, unspecified: Secondary | ICD-10-CM | POA: Insufficient documentation

## 2023-02-25 DIAGNOSIS — R112 Nausea with vomiting, unspecified: Secondary | ICD-10-CM | POA: Diagnosis not present

## 2023-02-25 DIAGNOSIS — R109 Unspecified abdominal pain: Secondary | ICD-10-CM

## 2023-02-25 DIAGNOSIS — R1011 Right upper quadrant pain: Secondary | ICD-10-CM | POA: Insufficient documentation

## 2023-02-25 NOTE — Patient Instructions (Signed)
Please complete labs at [facility] at [address] [place]. Abdominal ultrasound to be scheduled.  We will be in touch with results. Please call with any worsening symptoms.

## 2023-02-25 NOTE — Progress Notes (Signed)
GI Office Note    Referring Provider: Jerl Mina, MD Primary Care Physician:  Jerl Mina, MD  Primary Gastroenterologist: Roetta Sessions, MD   Chief Complaint   Chief Complaint  Patient presents with   Abdominal Pain    RLQ pain and vomiting lasting for last couple weeks     History of Present Illness   Samantha Hamilton is a 73 y.o. female presenting today at the request of Dr. Burnett Sheng for for vomiting, RLQ abdominal pain.   Labs November 2024: Glucose 94, creatinine 0.8, AST 34, ALT 35, alk phos 34, total bilirubin 0.5,   Labs May 2024: White blood cell count 7300, hemoglobin 13.1, platelets 297,000, TSH 0.852.  Today:   Two weeks of right sided abdominal pain associated with n/v. Pain bad for four days. Never really experienced something like this before. Also with some pain in the right lower back although she has been having some right hip/right low back pain previously for which she has received right SI joint injections. She was concerned her symptoms could be related to her gallbladder. At this time her symptoms have settled. She is still little sore in the abdomen. Appetite returning. No heartburn before this. She notes her episodes of vomiting occurred at night and woke her up from sleep. No dysuria. No hematuria. She says she has had elevated AST/ALT in the past that was suspected to be medication related and numbers were normal in 12/2022. BMs changed about four years ago. She used to take fiber to facilitate regular stools but she started having frequent blow outs. She now takes one imodium at bedtime and has normal stools. If she misses a dose, she has diarrhea. No melena, brbpr. Previously contributed her diarrhea to medications, stating that several had magnesium as the inactive ingredient.     Colonoscopy 04/2014: -colonic diverticulosis -colonoscopy 10 years  Medications   Current Outpatient Medications  Medication Sig Dispense Refill    amLODipine-benazepril (LOTREL) 10-20 MG per capsule Take 1 capsule by mouth daily.     atenolol (TENORMIN) 100 MG tablet Take 100 mg by mouth daily.     Biotin 1 MG CAPS Take 1 mg by mouth daily.     calcium-vitamin D (OSCAL WITH D) 500-200 MG-UNIT per tablet Take 1 tablet by mouth 2 (two) times daily.     estradiol (ESTRACE) 0.5 MG tablet Take 0.5 mg by mouth daily.     ferrous sulfate 325 (65 FE) MG tablet Take by mouth.     Krill Oil 1000 MG CAPS Take by mouth.     loperamide (IMODIUM A-D) 2 MG tablet Take 2 mg by mouth at bedtime.     loratadine (CLARITIN) 10 MG tablet Take by mouth.     spironolactone (ALDACTONE) 25 MG tablet Take 25 mg by mouth 2 (two) times daily.     SYNTHROID 88 MCG tablet Take by mouth.     triamcinolone (KENALOG) 0.1 % paste      zinc gluconate 50 MG tablet Take 50 mg by mouth daily.     No current facility-administered medications for this visit.    Allergies   Allergies as of 02/25/2023 - Review Complete 02/25/2023  Allergen Reaction Noted   Codeine Nausea Only 07/05/2015   Metoprolol tartrate  06/19/2015   Other  06/19/2015   Transderm-scop [scopolamine] Nausea And Vomiting 06/19/2015    Past Medical History   Past Medical History:  Diagnosis Date   Arthritis    Heart rate  fast    Hyperlipidemia    Hypertension    Hypothyroidism    PONV (postoperative nausea and vomiting)     Past Surgical History   Past Surgical History:  Procedure Laterality Date   ABDOMINAL HYSTERECTOMY     BREAST BIOPSY Right 06/08/2013   neg- APOCRINE CYSTS   COLONOSCOPY N/A 04/14/2014   Procedure: COLONOSCOPY;  Surgeon: Corbin Ade, MD;  Location: [facility];  Service: Endoscopy;  Laterality: N/A;  11:30 AM   KNEE ARTHROSCOPY Right    KNEE ARTHROSCOPY Left 06/27/2015   Procedure: ARTHROSCOPY KNEE, Partial Medial Menisectomy, Chondral Debridement ;  Surgeon: Erin Sons, MD;  Location: [facility];  Service: Orthopedics;  Laterality: Left;   TUBAL LIGATION       Past Family History   Family History  Problem Relation Age of Onset   Breast cancer Neg Hx    Celiac disease Neg Hx    Inflammatory bowel disease Neg Hx    Colon cancer Neg Hx     Past Social History   Social History   Socioeconomic History   Marital status: Divorced    Spouse name: Not on file   Number of children: Not on file   Years of education: Not on file   Highest education level: Not on file  Occupational History   Not on file  Tobacco Use   Smoking status: Never   Smokeless tobacco: Never  Substance and Sexual Activity   Alcohol use: No   Drug use: No   Sexual activity: Not Currently  Other Topics Concern   Not on file  Social History Narrative   Not on file   Social Drivers of Health   Financial Resource Strain: Low Risk  (06/24/2022)   Received [facility], [facility]   Overall Financial Resource Strain (CARDIA)    Difficulty of Paying Living Expenses: Not hard at all  Food Insecurity: No Food Insecurity (06/24/2022)   Received from [facility], [facility]   Hunger Vital Sign    Worried About Running Out of Food in the Last Year: Never true    Ran Out of Food in the Last Year: Never true  Transportation Needs: No Transportation Needs (06/24/2022)   Received from [facility], [facility]   [facility] - Transportation    In the past 12 months, has lack of transportation kept you from medical appointments or from getting medications?: No    Lack of Transportation (Non-Medical): No  Physical Activity: Not on file  Stress: Not on file  Social Connections: Not on file  Intimate Partner Violence: Not on file    Review of Systems   General: Negative for anorexia, unintentional weight loss, fever, chills, fatigue, weakness. Eyes: Negative for vision changes.  ENT: Negative for hoarseness, difficulty swallowing , nasal congestion. CV:  Negative for chest pain, angina, palpitations, dyspnea on exertion, peripheral edema.  Respiratory: Negative for dyspnea at rest, dyspnea on exertion, cough, sputum, wheezing.  GI: See history of present illness. GU:  Negative for dysuria, hematuria, urinary incontinence, urinary frequency, nocturnal urination.  MS: see hpi.  Derm: Negative for rash or itching.  Neuro: Negative for weakness, abnormal sensation, seizure, frequent headaches, memory loss,  confusion.  Psych: Negative for anxiety, depression, suicidal ideation, hallucinations.  Endo: Negative for unusual weight change.  Heme: Negative for bruising or bleeding. Allergy: Negative for rash or hives.  Physical Exam   BP 112/64 (  BP Location: Right Arm, Patient Position: Sitting, Cuff Size: Normal)   Pulse 71   Temp 98.9 F (37.2 C) (Oral)   Ht 5\' 2"  (1.575 m)   Wt 162 lb 12.8 oz (73.8 kg)   SpO2 97%   BMI 29.78 kg/m    General: Well-nourished, well-developed in no acute distress.  Head: Normocephalic, atraumatic.   Eyes: Conjunctiva pink, no icterus. Mouth: Oropharyngeal mucosa moist and pink , no lesions erythema or exudate. Neck: Supple without thyromegaly, masses, or lymphadenopathy.  Lungs: Clear to auscultation bilaterally.  Heart: Regular rate and rhythm, no murmurs rubs or gallops.  Abdomen: Bowel sounds are normal,  nondistended, no hepatosplenomegaly or masses,  no abdominal bruits or hernia, no rebound or guarding.  Mild right sided abdominal tenderness mostly mid abd. No murphy's sign Rectal: not performed Extremities: No lower extremity edema. No clubbing or deformities.  Neuro: Alert and oriented x 4 , grossly normal neurologically.  Skin: Warm and dry, no rash or jaundice.   Psych: Alert and cooperative, normal mood and affect.  Labs   See hpi  Imaging Studies   No results found.  Assessment/Plan:     Right sided abdominal pain with N/V: -etiology unclear, location of patient somewhat atypical  for biliary source, interestingly she also noted vomiting only at night which would be more suggestive of reflux. She has history of right hip/right back pain which may be more related to her SI joint, which has required injections.  -she has been self medicating with imodium at night for several years for diarrhea, ?underlying colon source for her right sided abdominal pain -obtain labs including sed rate, crp, ttg iga, cbc, cmet, lipase -abd u/s, if negative she may require CT if her symptoms persist      Leanna Battles. Melvyn Neth, MHS, PA-C [facility]

## 2023-02-25 NOTE — Telephone Encounter (Signed)
Pt informed of appt date, time, instructions and location

## 2023-02-25 NOTE — Telephone Encounter (Signed)
[facility]  [place] scheduled for Wednesday 03/04/23, arrive at 9:15 am to check in, NPO after midnight

## 2023-02-27 LAB — CBC WITH DIFFERENTIAL/PLATELET
Basophils Absolute: 0 10*3/uL (ref 0.0–0.2)
Basos: 1 %
EOS (ABSOLUTE): 0.2 10*3/uL (ref 0.0–0.4)
Eos: 3 %
Hematocrit: 38 % (ref 34.0–46.6)
Hemoglobin: 12.7 g/dL (ref 11.1–15.9)
Immature Grans (Abs): 0 10*3/uL (ref 0.0–0.1)
Immature Granulocytes: 0 %
Lymphocytes Absolute: 2.5 10*3/uL (ref 0.7–3.1)
Lymphs: 33 %
MCH: 31.2 pg (ref 26.6–33.0)
MCHC: 33.4 g/dL (ref 31.5–35.7)
MCV: 93 fL (ref 79–97)
Monocytes Absolute: 0.6 10*3/uL (ref 0.1–0.9)
Monocytes: 8 %
Neutrophils Absolute: 4.1 10*3/uL (ref 1.4–7.0)
Neutrophils: 55 %
Platelets: 351 10*3/uL (ref 150–450)
RBC: 4.07 x10E6/uL (ref 3.77–5.28)
RDW: 12 % (ref 11.7–15.4)
WBC: 7.5 10*3/uL (ref 3.4–10.8)

## 2023-02-27 LAB — COMPREHENSIVE METABOLIC PANEL
ALT: 38 [IU]/L — ABNORMAL HIGH (ref 0–32)
AST: 38 [IU]/L (ref 0–40)
Albumin: 3.9 g/dL (ref 3.8–4.8)
Alkaline Phosphatase: 56 [IU]/L (ref 44–121)
BUN/Creatinine Ratio: 14 (ref 12–28)
BUN: 13 mg/dL (ref 8–27)
Bilirubin Total: 0.4 mg/dL (ref 0.0–1.2)
CO2: 26 mmol/L (ref 20–29)
Calcium: 9.6 mg/dL (ref 8.7–10.3)
Chloride: 103 mmol/L (ref 96–106)
Creatinine, Ser: 0.9 mg/dL (ref 0.57–1.00)
Globulin, Total: 3 g/dL (ref 1.5–4.5)
Glucose: 98 mg/dL (ref 70–99)
Potassium: 4.6 mmol/L (ref 3.5–5.2)
Sodium: 141 mmol/L (ref 134–144)
Total Protein: 6.9 g/dL (ref 6.0–8.5)
eGFR: 67 mL/min/{1.73_m2} (ref >59)

## 2023-02-27 LAB — IGA: IgA/Immunoglobulin A, Serum: 250 mg/dL (ref 64–422)

## 2023-02-27 LAB — SEDIMENTATION RATE: Sed Rate: 7 mm/h (ref 0–40)

## 2023-02-27 LAB — TISSUE TRANSGLUTAMINASE, IGA: Transglutaminase IgA: <2 U/mL (ref 0–3)

## 2023-02-27 LAB — LIPASE: Lipase: 27 U/L (ref 14–85)

## 2023-02-27 LAB — C-REACTIVE PROTEIN: CRP: 1 mg/L (ref 0–10)

## 2023-03-02 ENCOUNTER — Encounter: Payer: Self-pay | Admitting: Family Medicine

## 2023-03-03 ENCOUNTER — Other Ambulatory Visit: Payer: Self-pay | Admitting: Family Medicine

## 2023-03-03 DIAGNOSIS — R921 Mammographic calcification found on diagnostic imaging of breast: Secondary | ICD-10-CM

## 2023-03-04 ENCOUNTER — Ambulatory Visit (HOSPITAL_COMMUNITY)
Admission: RE | Admit: 2023-03-04 | Discharge: 2023-03-04 | Disposition: A | Discharge status: Home / Self Care | Payer: Medicare HMO | Source: Ambulatory Visit | Attending: Gastroenterology | Admitting: Gastroenterology

## 2023-03-04 DIAGNOSIS — R197 Diarrhea, unspecified: Secondary | ICD-10-CM | POA: Diagnosis present

## 2023-03-04 DIAGNOSIS — R1011 Right upper quadrant pain: Secondary | ICD-10-CM | POA: Diagnosis present

## 2023-03-04 DIAGNOSIS — R112 Nausea with vomiting, unspecified: Secondary | ICD-10-CM | POA: Insufficient documentation

## 2023-03-06 ENCOUNTER — Other Ambulatory Visit: Payer: Self-pay | Admitting: Gastroenterology

## 2023-03-06 MED ORDER — PANTOPRAZOLE SODIUM 40 MG PO TBEC: 40.0000 mg | DELAYED_RELEASE_TABLET | Freq: Every day | ORAL | 1 refills | Status: DC

## 2023-03-10 ENCOUNTER — Ambulatory Visit
Admission: RE | Admit: 2023-03-10 | Discharge: 2023-03-10 | Disposition: A | Discharge status: Home / Self Care | Payer: Medicare HMO | Source: Ambulatory Visit | Attending: Family Medicine | Admitting: Family Medicine

## 2023-03-10 DIAGNOSIS — R921 Mammographic calcification found on diagnostic imaging of breast: Secondary | ICD-10-CM | POA: Diagnosis present

## 2023-03-29 NOTE — Progress Notes (Unsigned)
 GI Office Note    Referring Provider: Jerl Mina, MD Primary Care Physician:  Jerl Mina, MD  Primary Gastroenterologist: Roetta Sessions, MD   Chief Complaint   No chief complaint on file.   History of Present Illness   Samantha Hamilton is a 73 y.o. female presenting today for follow up. Last seen 02/2023 for vomiting, RLQ pain. She has chronic diarrhea, self medicating at night with imodium.   Presenting back today to decide if consider gb surgery, CT, TCS***  Labs 02/2023: celiac serologies negative. Lipase normal. LFTs normal except ALT 38H. CBC normal. Sed rate 7, CRP 1.   Abd u/s 02/2023: IMPRESSION: 1. Increased hepatic echotexture, most commonly seen with steatosis. Correlation with LFT's is recommended. 2. Partially contracted gallbladder with sludge and possible tiny stones. No biliary dilatation.  Colonoscopy 04/2014: -colonic diverticulosis -colonoscopy 10 years     Medications   Current Outpatient Medications  Medication Sig Dispense Refill   amLODipine-benazepril (LOTREL) 10-20 MG per capsule Take 1 capsule by mouth daily.     atenolol (TENORMIN) 100 MG tablet Take 100 mg by mouth daily.     Biotin 1 MG CAPS Take 1 mg by mouth daily.     calcium-vitamin D (OSCAL WITH D) 500-200 MG-UNIT per tablet Take 1 tablet by mouth 2 (two) times daily.     estradiol (ESTRACE) 0.5 MG tablet Take 0.5 mg by mouth daily.     ferrous sulfate 325 (65 FE) MG tablet Take by mouth.     Krill Oil 1000 MG CAPS Take by mouth.     loperamide (IMODIUM A-D) 2 MG tablet Take 2 mg by mouth at bedtime.     loratadine (CLARITIN) 10 MG tablet Take by mouth.     pantoprazole (PROTONIX) 40 MG tablet Take 1 tablet (40 mg total) by mouth daily before breakfast. 90 tablet 1   spironolactone (ALDACTONE) 25 MG tablet Take 25 mg by mouth 2 (two) times daily.     SYNTHROID 88 MCG tablet Take by mouth.     triamcinolone (KENALOG) 0.1 % paste      zinc gluconate 50 MG tablet Take 50 mg  by mouth daily.     No current facility-administered medications for this visit.    Allergies   Allergies as of 03/30/2023 - Review Complete 02/25/2023  Allergen Reaction Noted   Codeine Nausea Only 07/05/2015   Metoprolol tartrate  06/19/2015   Other  06/19/2015   Transderm-scop [scopolamine] Nausea And Vomiting 06/19/2015     Past Medical History   Past Medical History:  Diagnosis Date   Arthritis    Heart rate fast    Hyperlipidemia    Hypertension    Hypothyroidism    PONV (postoperative nausea and vomiting)     Past Surgical History   Past Surgical History:  Procedure Laterality Date   ABDOMINAL HYSTERECTOMY     BREAST BIOPSY Right 06/08/2013   neg- APOCRINE CYSTS   COLONOSCOPY N/A 04/14/2014   Procedure: COLONOSCOPY;  Surgeon: Corbin Ade, MD;  [facility];  Service: Endoscopy;  Laterality: N/A;  11:30 AM   KNEE ARTHROSCOPY Right    KNEE ARTHROSCOPY Left 06/27/2015   Procedure: ARTHROSCOPY KNEE, Partial Medial Menisectomy, Chondral Debridement ;  Surgeon: Erin Sons, MD;  Location: [facility];  Service: Orthopedics;  Laterality: Left;   TUBAL LIGATION      Past Family History   Family History  Problem Relation Age of Onset   Breast cancer  Neg Hx    Celiac disease Neg Hx    Inflammatory bowel disease Neg Hx    Colon cancer Neg Hx     Past Social History   Social History   Socioeconomic History   Marital status: Divorced    Spouse name: Not on file   Number of children: Not on file   Years of education: Not on file   Highest education level: Not on file  Occupational History   Not on file  Tobacco Use   Smoking status: Never   Smokeless tobacco: Never  Substance and Sexual Activity   Alcohol use: No   Drug use: No   Sexual activity: Not Currently  Other Topics Concern   Not on file  Social History Narrative   Not on file   Social Drivers of Health   Financial Resource Strain: Low Risk  (06/24/2022)   Received from  [facility], [facility]   Overall Financial Resource Strain (CARDIA)    Difficulty of Paying Living Expenses: Not hard at all  Food Insecurity: No Food Insecurity (06/24/2022)   Received from [facility], [facility]   Hunger Vital Sign    Worried About Running Out of Food in the Last Year: Never true    Ran Out of Food in the Last Year: Never true  Transportation Needs: No Transportation Needs (06/24/2022)   Received from [facility], [facility]   [facility] - Transportation    In the past 12 months, has lack of transportation kept you from medical appointments or from getting medications?: No    Lack of Transportation (Non-Medical): No  Physical Activity: Not on file  Stress: Not on file  Social Connections: Not on file  Intimate Partner Violence: Not on file    Review of Systems   General: Negative for anorexia, weight loss, fever, chills, fatigue, weakness. ENT: Negative for hoarseness, difficulty swallowing , nasal congestion. CV: Negative for chest pain, angina, palpitations, dyspnea on exertion, peripheral edema.  Respiratory: Negative for dyspnea at rest, dyspnea on exertion, cough, sputum, wheezing.  GI: See history of present illness. GU:  Negative for dysuria, hematuria, urinary incontinence, urinary frequency, nocturnal urination.  Endo: Negative for unusual weight change.     Physical Exam   There were no vitals taken for this visit.   General: Well-nourished, well-developed in no acute distress.  Eyes: No icterus. Mouth: Oropharyngeal mucosa moist and pink , no lesions erythema or exudate. Lungs: Clear to auscultation bilaterally.  Heart: Regular rate and rhythm, no murmurs rubs or gallops.  Abdomen: Bowel sounds are normal, nontender, nondistended, no hepatosplenomegaly or masses,  no abdominal bruits or hernia , no rebound or guarding.  Rectal: ***   Extremities: No lower extremity edema. No clubbing or deformities. Neuro: Alert and oriented x 4   Skin: Warm and dry, no jaundice.   Psych: Alert and cooperative, normal mood and affect.  Labs   *** Imaging Studies   MM 3D DIAGNOSTIC MAMMOGRAM UNILATERAL RIGHT BREAST Result Date: 03/10/2023 CLINICAL DATA:  First six-month follow-up for probably benign calcifications in the RIGHT breast. EXAM: DIGITAL DIAGNOSTIC UNILATERAL RIGHT MAMMOGRAM WITH TOMOSYNTHESIS AND CAD TECHNIQUE: Right digital diagnostic mammography and breast tomosynthesis was performed. The images were evaluated with computer-aided detection. COMPARISON:  Previous exam(s). ACR Breast Density Category c: The breasts are heterogeneously dense, which may obscure small masses. FINDINGS: Spot magnification views demonstrate a 2.0 cm group of calcifications in  the lower inner RIGHT breast middle depth, which demonstrate slight interval coarsening since August 06, 2022 and are otherwise stable in size. These are favored to represent early secretory calcifications. No new suspicious mass, calcification, or other findings are identified in the right breast. IMPRESSION: Documented six-month stability of a probably benign group of calcifications in the RIGHT breast, favored to represent early secretory calcifications. RECOMMENDATION: BILATERAL diagnostic mammogram in 6 months. I have discussed the findings and recommendations with the patient. If applicable, a reminder letter will be sent to the patient regarding the next appointment. BI-RADS CATEGORY  3: Probably benign. Electronically Signed   By: Jacob Moores M.D.   On: 03/10/2023 16:14   US Abdomen Complete Result Date: 03/04/2023 : PROCEDURE: ULTRASOUND ABDOMEN COMPLETE HISTORY: Patient is a 73 y/o Female with right lower quadrant pain with nausea and vomiting for 2 weeks. Hypertension. Hyperlipidemia. COMPARISON: None available. TECHNIQUE: Two-dimensional grayscale and color Doppler  ultrasound of the abdomen was performed. FINDINGS: The pancreas demonstrates a normal homogenous echotexture with the tail suboptimally visualized due to overlying bowel gas. The liver demonstrates an increased echotexture without intrahepatic biliary dilatation. No masses are visualized. The main portal vein demonstrates normal hepatopedal flow. The gallbladder appears partially contracted and demonstrates sludge and possible tiny stones. There is no pericholecystic fluid or wall thickening. The common bile duct measures 0.4 cm. Negative sonographic Murphy's sign. The right kidney measures 11.8 cm in length. Renal cortical echotexture is normal. There is no hydronephrosis. There are no stones. There are no cysts. The left kidney measures 10.0 cm in length. Renal cortical echotexture is normal. There is no hydronephrosis. There are no stones. There are no cysts. The spleen measures 9.6 cm in length and demonstrates normal echotexture. There is no evidence of aneurysm within the visualized segments of the abdominal aorta. The visualized segments of the IVC are unremarkable. IMPRESSION: 1. Increased hepatic echotexture, most commonly seen with steatosis. Correlation with LFT's is recommended. 2. Partially contracted gallbladder with sludge and possible tiny stones. No biliary dilatation. Thank you for allowing [place] to assist in the care of this patient. Electronically Signed   By: Lestine Box M.D.   On: 03/04/2023 16:30    Assessment       PLAN   ***   Leanna Battles. Melvyn Neth, MHS, PA-C [facility]

## 2023-03-30 ENCOUNTER — Other Ambulatory Visit: Payer: Self-pay

## 2023-03-30 ENCOUNTER — Other Ambulatory Visit: Payer: Self-pay | Admitting: *Deleted

## 2023-03-30 ENCOUNTER — Encounter: Payer: Self-pay | Admitting: Gastroenterology

## 2023-03-30 ENCOUNTER — Ambulatory Visit: Payer: Medicare HMO | Admitting: Gastroenterology

## 2023-03-30 ENCOUNTER — Telehealth: Payer: Medicare HMO | Admitting: Gastroenterology

## 2023-03-30 VITALS — BP 115/68 | HR 88 | Temp 98.3°F | Ht 62.0 in | Wt 153.0 lb

## 2023-03-30 DIAGNOSIS — K760 Fatty (change of) liver, not elsewhere classified: Secondary | ICD-10-CM | POA: Diagnosis not present

## 2023-03-30 DIAGNOSIS — R7401 Elevation of levels of liver transaminase levels: Secondary | ICD-10-CM

## 2023-03-30 DIAGNOSIS — R1031 Right lower quadrant pain: Secondary | ICD-10-CM

## 2023-03-30 DIAGNOSIS — R197 Diarrhea, unspecified: Secondary | ICD-10-CM

## 2023-03-30 DIAGNOSIS — R198 Other specified symptoms and signs involving the digestive system and abdomen: Secondary | ICD-10-CM | POA: Insufficient documentation

## 2023-03-30 DIAGNOSIS — R1011 Right upper quadrant pain: Secondary | ICD-10-CM

## 2023-03-30 DIAGNOSIS — D509 Iron deficiency anemia, unspecified: Secondary | ICD-10-CM

## 2023-03-30 NOTE — Telephone Encounter (Signed)
 [employer] PA :  Authorization Number: [id number] Case Number: [id number] Review Date: 03/30/2023 2:19:35 PM Expiration Date: 09/26/2023 Status: Your case has been Approved. The prior authorization you submitted, Case [id number], has been received.

## 2023-03-30 NOTE — Patient Instructions (Signed)
 Continue pantoprazole 40mg  daily before breakfast at least six more weeks. If you are feeling ok at that time, you can stop medication.  Colonoscopy to be scheduled in 06/2023 to evaluated change in bowels, diarrhea, abdominal pain. We will update liver labs and special ultrasound to check for fibrosis around 09/2023. We will send you reminder closer to 09/2023.

## 2023-03-30 NOTE — Telephone Encounter (Signed)
 Pt informed that CT scheduled for Wednesday 04/01/23, arrive at 11:45 am to check in at [facility]

## 2023-03-30 NOTE — Telephone Encounter (Addendum)
 Please let patient know, after discussion with Dr. Jena Gauss, we are suggesting she consider CT A/P with contrast to further evaluation her RLQ pain before her colonoscopy is done. Just to make sure she does not have something like a low grade chronic appendicitis etc. If agreeable, please schedule.    She also needs labs in 09/2023, she can go same time she goes for elastography.   CMET, CBC, PT/INR, ELF, NASH fibrosure, ANA by IF with reflex, Hep A total antibody, Hep B surf antibody, IgG/IgM, iron/tibc/ferritin

## 2023-03-30 NOTE — Telephone Encounter (Signed)
 Pt was made aware and verbalized understanding. Pt is agreeable with moving forward with CT, will need to be scheduled on a M, W or F. Labs were ordered and will be mailed to the pt to have done at the same time as her [place].

## 2023-04-01 ENCOUNTER — Ambulatory Visit (HOSPITAL_COMMUNITY)
Admission: RE | Admit: 2023-04-01 | Discharge: 2023-04-01 | Disposition: A | Discharge status: Home / Self Care | Payer: Medicare HMO | Source: Ambulatory Visit | Attending: Gastroenterology | Admitting: Gastroenterology

## 2023-04-01 DIAGNOSIS — R1031 Right lower quadrant pain: Secondary | ICD-10-CM | POA: Diagnosis present

## 2023-04-01 MED ORDER — IOHEXOL 300 MG/ML  SOLN
100.0000 mL | Freq: Once | INTRAMUSCULAR | Status: AC | PRN
  Administered 2023-04-01: 100 mL via INTRAVENOUS

## 2023-04-15 ENCOUNTER — Other Ambulatory Visit: Payer: Self-pay | Admitting: *Deleted

## 2023-04-15 DIAGNOSIS — N949 Unspecified condition associated with female genital organs and menstrual cycle: Secondary | ICD-10-CM

## 2023-05-19 ENCOUNTER — Telehealth: Payer: Self-pay | Admitting: *Deleted

## 2023-05-19 NOTE — Telephone Encounter (Signed)
 Called to schedule pt for procedure for May. The date that was given pt couldn't do and she said she could only do Monday's. She says she will wait until June. Will call once we get providers June schedule.

## 2023-05-25 ENCOUNTER — Ambulatory Visit: Admitting: Obstetrics & Gynecology

## 2023-05-25 ENCOUNTER — Encounter: Payer: Self-pay | Admitting: Obstetrics & Gynecology

## 2023-05-25 VITALS — BP 142/66 | HR 61 | Ht 62.0 in | Wt 152.2 lb

## 2023-05-25 DIAGNOSIS — N83201 Unspecified ovarian cyst, right side: Secondary | ICD-10-CM

## 2023-05-25 NOTE — Progress Notes (Signed)
   GYN VISIT Patient name: Samantha Hamilton MRN [id number]  Date of birth: 1950/04/23 Chief Complaint:   Gynecologic Exam (Has adnexal cyst)  History of Present Illness:   Samantha Hamilton is a 73 y.o. G2P2 PM, PH female being seen today for the following concerns:  Right adnexal cyst:  Finding were noted on recent CT in Feb. 2025.  She had reported some GI concerns so imaging was completed. Initial Limited Upper abdominal US  showed no abnormalities so CT was completed.  Noted soreness and vomiting.  Denies abdominal or pelvic pain, maybe some soreness with palpation.  Notes considerable diarrhea- taking imodium daily- ongoing issue x 3 yrs.  Denies bloating.  In the process of scheduling colonoscopy  Denies vaginal discharge, itching or irritation. Denies vaginal bleeding.  No urinary concerns  No LMP recorded. Patient has had a hysterectomy.  CT abd/pelvis: 03/2023: Prior hysterectomy.  A benign appearing right adnexal cyst measuring 4.1 x 3.7 cm.  No free fluid   Review of Systems:   Pertinent items are noted in HPI Denies fever/chills, dizziness, headaches, visual disturbances, fatigue, shortness of breath, chest pain Pertinent History Reviewed:   Past Surgical History:  Procedure Laterality Date   ABDOMINAL HYSTERECTOMY     BREAST BIOPSY Right 06/08/2013   neg- APOCRINE CYSTS   COLONOSCOPY N/A 04/14/2014   Procedure: COLONOSCOPY;  Surgeon: Suzette Espy, MD;  [facility];  Service: Endoscopy;  Laterality: N/A;  11:30 AM   KNEE ARTHROSCOPY Right    KNEE ARTHROSCOPY Left 06/27/2015   Procedure: ARTHROSCOPY KNEE, Partial Medial Menisectomy, Chondral Debridement ;  Surgeon: Josephus Nida, MD;  Location: [facility];  Service: Orthopedics;  Laterality: Left;   TUBAL LIGATION      Past Medical History:  Diagnosis Date   Arthritis    Fatty liver    Heart rate fast    Hyperlipidemia    Hypertension    Hypothyroidism    PONV (postoperative nausea and vomiting)     Reviewed problem list, medications and allergies. Physical Assessment:   Vitals:   05/25/23 1017  BP: (!) 142/66  Pulse: 61  Weight: 152 lb 3.2 oz (69 kg)  Height: 5\' 2"  (1.575 m)  Body mass index is 27.84 kg/m.       Physical Examination:   General appearance: alert, well appearing, and in no distress  Psych: mood appropriate, normal affect  Skin: warm & dry   Cardiovascular: normal heart rate noted  Respiratory: normal respiratory effort, no distress  Abdomen: soft, +RLQ tenderness with deep palpation, no rebound, no guarding  Pelvic: VULVA: normal appearing vulva with no masses, tenderness or lesions.  Bimanual exam only- small left vaginal cyst palpated.  Right sided tenderness noted.  Uterus surgically absent  Extremities: no edema   Chaperone:  pt declined     Assessment & Plan:  1) Right adnexal mass -reviewed prior CT and did review that findings were suggestive of benign incidental cyst  - Mild tenderness noted on exam -recommendation to proceed with pelvic US  for further evaluation and to assess next step  2) Diarrhea -in process of work up with GI    Orders Placed This Encounter  Procedures   US  PELVIC COMPLETE WITH TRANSVAGINAL    Return for pelvic US  next available (will call with results).   Janay Canan, DO Attending Obstetrician & Gynecologist, [facility] for [employer], [facility]

## 2023-06-08 ENCOUNTER — Encounter: Payer: Self-pay | Admitting: *Deleted

## 2023-06-08 NOTE — Telephone Encounter (Signed)
 Pt has been scheduled for 07/13/23. Instructions printed and placed up front with clenpiq prep to be picked up

## 2023-06-10 ENCOUNTER — Ambulatory Visit: Admitting: Radiology

## 2023-06-10 DIAGNOSIS — N83201 Unspecified ovarian cyst, right side: Secondary | ICD-10-CM

## 2023-06-10 NOTE — Progress Notes (Signed)
 GYN US : TA and TV imaging performed - vinyl probe cover used - Chaperone: Keila Hx Tubal Ligation 1987   Hx Hyst in 1989 Vaginal cuff and surrounding region - The ovaries are not mobile, both seen close to midline of pelvis.  The right ovary is seen with several avascular simple echofree cysts, thin walls, smoothly marginated measuring 35 x 26 mm, 17 x 4 mm and 8 x 6 mm.  The Left ovary is also seen with multiple small simple echofree avascular cysts measuring 14 x 9 mm, 10 x 6 mm and 6 x 3 mm  The adnexal regions appear neg, neg CDS, no free fluid present

## 2023-06-15 ENCOUNTER — Other Ambulatory Visit: Payer: Self-pay | Admitting: Obstetrics & Gynecology

## 2023-06-15 DIAGNOSIS — N83201 Unspecified ovarian cyst, right side: Secondary | ICD-10-CM

## 2023-06-15 NOTE — Progress Notes (Signed)
 Order for pelvic US  in 49yr

## 2023-06-17 ENCOUNTER — Other Ambulatory Visit: Payer: Self-pay | Admitting: *Deleted

## 2023-06-17 DIAGNOSIS — R1011 Right upper quadrant pain: Secondary | ICD-10-CM

## 2023-06-17 NOTE — Telephone Encounter (Signed)
 Please schedule HIDA. DX RUQ pain.  Please make ov with me for two weeks. I'm not sure if HIDA will be back yet but just noticing it has been four months since we saw her and she is on for a colonoscopy next month. Delayed due to scheduling conflicts. Needs update ov.

## 2023-06-25 ENCOUNTER — Ambulatory Visit: Payer: Self-pay | Admitting: Gastroenterology

## 2023-06-25 ENCOUNTER — Encounter (HOSPITAL_COMMUNITY)
Admission: RE | Admit: 2023-06-25 | Discharge: 2023-06-25 | Disposition: A | Discharge status: Home / Self Care | Source: Ambulatory Visit | Attending: Gastroenterology | Admitting: Gastroenterology

## 2023-06-25 DIAGNOSIS — R1011 Right upper quadrant pain: Secondary | ICD-10-CM | POA: Diagnosis present

## 2023-06-25 MED ORDER — TECHNETIUM TC 99M MEBROFENIN IV KIT
5.0000 | PACK | Freq: Once | INTRAVENOUS | Status: AC | PRN
  Administered 2023-06-25: 5 via INTRAVENOUS

## 2023-06-25 MED ORDER — STERILE WATER FOR INJECTION IJ SOLN
INTRAMUSCULAR | Status: AC
  Filled 2023-06-25: qty 10

## 2023-06-25 MED ORDER — SINCALIDE 5 MCG IJ SOLR
INTRAMUSCULAR | Status: AC
  Filled 2023-06-25: qty 5

## 2023-07-08 ENCOUNTER — Ambulatory Visit: Admitting: Gastroenterology

## 2023-07-08 ENCOUNTER — Encounter: Payer: Self-pay | Admitting: Gastroenterology

## 2023-07-08 VITALS — BP 132/63 | HR 60 | Temp 98.2°F | Ht 61.0 in | Wt 155.4 lb

## 2023-07-08 DIAGNOSIS — R112 Nausea with vomiting, unspecified: Secondary | ICD-10-CM

## 2023-07-08 DIAGNOSIS — R1010 Upper abdominal pain, unspecified: Secondary | ICD-10-CM | POA: Diagnosis not present

## 2023-07-08 DIAGNOSIS — R197 Diarrhea, unspecified: Secondary | ICD-10-CM

## 2023-07-08 DIAGNOSIS — K760 Fatty (change of) liver, not elsewhere classified: Secondary | ICD-10-CM | POA: Diagnosis not present

## 2023-07-08 NOTE — H&P (View-Only) (Signed)
 GI Office Note    Referring Provider: Lyle San, MD Primary Care Physician:  Lyle San, MD  Primary Gastroenterologist: Rheba Cedar, MD   Chief Complaint   Chief Complaint  Patient presents with   Follow-up    Here for follow up from HIDA scan    History of Present Illness   Samantha Hamilton is a 73 y.o. female presenting today for follow up. Last seen 03/2023. History of chronic diarrhea, RLQ pain.   Continues to have intermittent episodes of postprandial upper abdominal pain. Occurs 30 minutes to 2 hours after meals. Does not seem to matter what she eats. Occurring about once per month. Typically associated with N/V and then stomach feels like one big tooth ache. At other times she has constant right sided flank pain. No typical heartburn. No regurgitation. She continues to have diarrhea, taking imodium 1-2 times daily to help control. No melena, brbpr.   Recent HIDA with CCK. Developed abdominal cramping and nausea after injection. Felt like she may have to vomit or have a BM.   HIDA 06/2023:GBEF 28% with CCK.   CT A/P with contrast for RLQ pain 04/2023:  IMPRESSION: 4.1 cm benign-appearing right adnexal cyst. Recommend follow-up US  in 6-12 months. Note: This recommendation does not apply to premenarchal patients and to those with increased risk (genetic, family history, elevated tumor markers or other high-risk factors) of ovarian cancer. Reference: JACR 2020 Feb; 17(2):248-254   Tiny right renal calculus. No evidence of ureteral calculi or hydronephrosis.   Colonic diverticulosis, without radiographic evidence of diverticulitis.   Labs from May 2025: Blood cell count 6.4, hemoglobin 14.4, platelets 243,000, glucose 95, sodium 141, potassium 4.3, BUN 11, creatinine 0.7, total cholesterol 208, LDL 131, Tbili 0.5, AP 47, AST 18, ALT 19, alb 3.8  Labs 02/2023: celiac serologies negative. Lipase normal. LFTs normal except ALT 38H. CBC normal. Sed rate 7, CRP  1.   Review of labs in 2013: ANA direct neg. ANA IF 1:80 homogenous pattern,  ASMA neg, AMA neg, HCV Ab neg, Hep B surf Ag neg, ceruloplasmin 25.1, Hep B core total neg, Hep A total ab. Alpha 1 antitrypsin 120    Abd u/s 02/2023: IMPRESSION: 1. Increased hepatic echotexture, most commonly seen with steatosis. Correlation with LFT's is recommended. 2. Partially contracted gallbladder with sludge and possible tiny stones. No biliary dilatation.  Colonoscopy 04/2014: -colonic diverticulosis -colonoscopy 10 years    Medications   Current Outpatient Medications  Medication Sig Dispense Refill   amLODipine-benazepril (LOTREL) 10-20 MG per capsule Take 1 capsule by mouth daily.     atenolol (TENORMIN) 100 MG tablet Take 100 mg by mouth daily.     calcium-vitamin D (OSCAL WITH D) 500-200 MG-UNIT per tablet Take 1 tablet by mouth 2 (two) times daily.     estradiol (ESTRACE) 2 MG tablet Take 2 mg by mouth daily.     ferrous sulfate 325 (65 FE) MG tablet Take by mouth.     Krill Oil 1000 MG CAPS Take by mouth.     loperamide (IMODIUM A-D) 2 MG tablet Take 2 mg by mouth at bedtime.     loratadine (CLARITIN) 10 MG tablet Take by mouth.     spironolactone (ALDACTONE) 25 MG tablet Take 25 mg by mouth 2 (two) times daily.     SYNTHROID 88 MCG tablet Take by mouth.     triamcinolone (KENALOG) 0.1 % paste      zinc gluconate 50 MG tablet Take 50  mg by mouth daily.     No current facility-administered medications for this visit.    Allergies   Allergies as of 07/08/2023 - Review Complete 07/08/2023  Allergen Reaction Noted   Codeine Nausea Only 07/05/2015   Shellfish allergy Anaphylaxis 07/08/2023   Metoprolol tartrate  06/19/2015   Other  06/19/2015   Transderm-scop [scopolamine] Nausea And Vomiting 06/19/2015     Past Medical History   Past Medical History:  Diagnosis Date   Arthritis    Fatty liver    Heart rate fast    Hyperlipidemia    Hypertension    Hypothyroidism    PONV  (postoperative nausea and vomiting)     Past Surgical History   Past Surgical History:  Procedure Laterality Date   ABDOMINAL HYSTERECTOMY     BREAST BIOPSY Right 06/08/2013   neg- APOCRINE CYSTS   COLONOSCOPY N/A 04/14/2014   Procedure: COLONOSCOPY;  Surgeon: Suzette Espy, MD;  Location: [facility];  Service: Endoscopy;  Laterality: N/A;  11:30 AM   KNEE ARTHROSCOPY Right    KNEE ARTHROSCOPY Left 06/27/2015   Procedure: ARTHROSCOPY KNEE, Partial Medial Menisectomy, Chondral Debridement ;  Surgeon: Josephus Nida, MD;  Location: [facility];  Service: Orthopedics;  Laterality: Left;   TUBAL LIGATION      Past Family History   Family History  Problem Relation Age of Onset   Breast cancer Neg Hx    Celiac disease Neg Hx    Inflammatory bowel disease Neg Hx    Colon cancer Neg Hx     Past Social History   Social History   Socioeconomic History   Marital status: Divorced    Spouse name: Not on file   Number of children: Not on file   Years of education: Not on file   Highest education level: Not on file  Occupational History   Not on file  Tobacco Use   Smoking status: Never   Smokeless tobacco: Never  Substance and Sexual Activity   Alcohol use: No   Drug use: No   Sexual activity: Not Currently  Other Topics Concern   Not on file  Social History Narrative   Not on file   Social Drivers of Health   Financial Resource Strain: Low Risk  (06/11/2023)   Received from [facility]   Overall Financial Resource Strain (CARDIA)    Difficulty of Paying Living Expenses: Not hard at all  Food Insecurity: No Food Insecurity (06/11/2023)   Received from [facility]   Hunger Vital Sign    Worried About Running Out of Food in the Last Year: Never true    Ran Out of Food in the Last Year: Never true  Transportation Needs: No Transportation Needs (06/11/2023)   Received from [facility] - Transportation    In  the past 12 months, has lack of transportation kept you from medical appointments or from getting medications?: No    Lack of Transportation (Non-Medical): No  Physical Activity: Not on file  Stress: Not on file  Social Connections: Not on file  Intimate Partner Violence: Not on file    Review of Systems   General: Negative for anorexia, weight loss, fever, chills, fatigue, weakness. ENT: Negative for hoarseness, difficulty swallowing , nasal congestion. CV: Negative for chest pain, angina, palpitations, dyspnea on exertion, peripheral edema.  Respiratory: Negative for dyspnea at rest, dyspnea on exertion, cough, sputum, wheezing.  GI: See history of present illness.  GU:  Negative for dysuria, hematuria, urinary incontinence, urinary frequency, nocturnal urination.  Endo: Negative for unusual weight change.     Physical Exam   BP 132/63 (BP Location: Right Arm, Patient Position: Sitting, Cuff Size: Normal)   Pulse 60   Temp 98.2 F (36.8 C) (Oral)   Ht 5\' 1"  (1.549 m)   Wt 155 lb 6.4 oz (70.5 kg)   SpO2 97%   BMI 29.36 kg/m    General: Well-nourished, well-developed in no acute distress.  Eyes: No icterus. Mouth: Oropharyngeal mucosa moist and pink   Lungs: Clear to auscultation bilaterally.  Heart: Regular rate and rhythm, no murmurs rubs or gallops.  Abdomen: Bowel sounds are normal,  nondistended, no hepatosplenomegaly or masses,  no abdominal bruits or hernia , no rebound or guarding. Sore throughout abdomen Rectal: not performed  Extremities: No lower extremity edema. No clubbing or deformities. Neuro: Alert and oriented x 4   Skin: Warm and dry, no jaundice.   Psych: Alert and cooperative, normal mood and affect.  Labs   See hpi  Imaging Studies   NM Hepato W/EF Addendum Date: 06/26/2023 ADDENDUM REPORT: 06/26/2023 15:42 ADDENDUM: Patient received 1.4 mcg of CCK. Ensure was not utilized for calculation of ejection fraction for this study. Electronically Signed    By: Tama Fails M.D.   On: 06/26/2023 15:42   Result Date: 06/26/2023 CLINICAL DATA:  Right upper quadrant abdominal pain EXAM: NUCLEAR MEDICINE HEPATOBILIARY IMAGING WITH GALLBLADDER EF TECHNIQUE: Sequential images of the abdomen were obtained out to 60 minutes following intravenous administration of radiopharmaceutical. After oral ingestion of Ensure, gallbladder ejection fraction was determined. At 60 min, normal ejection fraction is greater than 33%. RADIOPHARMACEUTICALS:  5.0 mCi Tc-36m  Choletec  IV COMPARISON:  CT April 01, 2023 FINDINGS: Prompt uptake and biliary excretion of activity by the liver is seen. Gallbladder activity is visualized, consistent with patency of cystic duct. Biliary activity passes into small bowel, consistent with patent common bile duct. Calculated gallbladder ejection fraction is 28%. (Normal gallbladder ejection fraction with Ensure is greater than 33% and less than 80%.) IMPRESSION: Reduced gallbladder ejection fraction as can be seen with chronic cholecystitis/biliary dyskinesia. Electronically Signed: By: Tama Fails M.D. On: 06/25/2023 14:50   US  PELVIC COMPLETE WITH TRANSVAGINAL Result Date: 06/10/2023 Images from the original result were not included.  ..an Financial trader of Ultrasound Medicine Technical sales engineer) accredited practice Center for [facility] [address] [place] [address] Ordering Provider: Keene Pastures, DO GYNECOLOGIC SONOGRAM ELDENA DEDE is a 73 y.o. G2P2 No LMP recorded. Patient has had a hysterectomy. for a pelvic sonogram for Rt ov cyst seen on CAT scan 04/01/23 measuring 41 x 37 mm. Right ovary             4.1 x 2.9 x 4.1 cm, not mobile The right ovary is seen with several avascular simple echofree cysts, thin walls, smoothly marginated measuring 35 x 26 mm, 17 x 4 mm and 8 x 6 mm,  The largest simple cyst does not appear to have changed compared to CAT scan Feb. 26, 2025 Left ovary                1.7 x 1.6 x 2.8  cm, not mobile  The Left ovary is also seen with multiple small simple echofree avascular cysts measuring 14 x 9 mm, 10 x 6 mm and 6 x 3 mm Neg adnexal regions   -    neg CDS, no  free fluid present Technician Comments: GYN US : TA and TV imaging performed - vinyl probe cover used - Chaperone: Keila Hx Tubal Ligation 1987   Hx Hyst in 1989 Vaginal cuff and surrounding region - The ovaries are not mobile, both seen close to midline of pelvis.  The right ovary is seen with several avascular simple echofree cysts, thin walls, smoothly marginated measuring 35 x 26 mm, 17 x 4 mm and 8 x 6 mm.  The Left ovary is also seen with multiple small simple echofree avascular cysts measuring 14 x 9 mm, 10 x 6 mm and 6 x 3 mm  The adnexal regions appear neg, neg CDS, no free fluid present Brien Can 06/10/2023 12:22 PM Clinical Impression and recommendations: I have reviewed the sonogram results above, combined with the patient's current clinical course, below are my impressions and any appropriate recommendations for management based on the sonographic findings. Uterus and cervix surgically absent Right ovary with 3.5 x 2.6 cm unilocular anechoic smoothly marginated cyst.  Normal appearing left ovary. Smaller cysts seen within both ovaries that are consider physiologic. Based on the US  findings, O-RADS Cat 2- Almost certainly benign Recommend follow up ultrasound in 1 yr (O-RADS US  Risk Stratification and Management System: A Consensus Guidelines from the ACR Ovarian-Adnexal Reporting and Data System, Radiology 2020; [phone]) Jennifer Ozan, DO Attending Obstetrician & Gynecologist, Faculty Practice Center for [facility], [facility]    Assessment   Upper abdominal pain, N/V: postprandial, does not matter what she eats. U/s with sludge and possible tiny cholelithiasis. HIDA with slightly low GBEF and cramping abdominal pain and nausea with CCK.  -await colonoscopy findings -consider surgical  referral if no other explanation for symptoms.  -consider liver biopsy at time of cholecystectomy (if necessary) for staging of fatty liver.     Change in bowel habits/diarrhea -going on for few years, using imodium twice a day -last colonoscopy 2016 -inflammatory markers, celiac serologies negative -colonoscopy with random colon biopsies. ASA 2.  I have discussed the risks, alternatives, benefits with regards to but not limited to the risk of reaction to medication, bleeding, infection, perforation and the patient is agreeable to proceed. Written consent to be obtained. -patient asked to wait until 06/2023 for colonoscopy due to tax season   Fatty liver: -dates back to at least 2013, extensive work up for elevated LFTs at that time. ANA + as outlined.  -recommended to update serologies, noninvasive fibrosis staging, elastography in 09/2023 -return ov in 09/2023        Trudie Fuse. Harles Lied, MHS, PA-C [facility]

## 2023-07-08 NOTE — Progress Notes (Unsigned)
 GI Office Note    Referring Provider: Lyle San, MD Primary Care Physician:  Lyle San, MD  Primary Gastroenterologist: Rheba Cedar, MD   Chief Complaint   Chief Complaint  Patient presents with   Follow-up    Here for follow up from HIDA scan    History of Present Illness   Samantha Hamilton is a 73 y.o. female presenting today for follow up. Last seen 03/2023. History of chronic diarrhea, RLQ pain.   Continues to have intermittent episodes of postprandial upper abdominal pain. Occurs 30 minutes to 2 hours after meals. Does not seem to matter what she eats. Occurring about once per month. Typically associated with N/V and then stomach feels like one big tooth ache. At other times she has constant right sided flank pain. No typical heartburn. No regurgitation. She continues to have diarrhea, taking imodium 1-2 times daily to help control. No melena, brbpr.   Recent HIDA with CCK. Developed abdominal cramping and nausea after injection. Felt like she may have to vomit or have a BM.   HIDA 06/2023:GBEF 28% with CCK.   CT A/P with contrast for RLQ pain 04/2023:  IMPRESSION: 4.1 cm benign-appearing right adnexal cyst. Recommend follow-up US  in 6-12 months. Note: This recommendation does not apply to premenarchal patients and to those with increased risk (genetic, family history, elevated tumor markers or other high-risk factors) of ovarian cancer. Reference: JACR 2020 Feb; 17(2):248-254   Tiny right renal calculus. No evidence of ureteral calculi or hydronephrosis.   Colonic diverticulosis, without radiographic evidence of diverticulitis.   Labs from May 2025: Blood cell count 6.4, hemoglobin 14.4, platelets 243,000, glucose 95, sodium 141, potassium 4.3, BUN 11, creatinine 0.7, total cholesterol 208, LDL 131, Tbili 0.5, AP 47, AST 18, ALT 19, alb 3.8  Labs 02/2023: celiac serologies negative. Lipase normal. LFTs normal except ALT 38H. CBC normal. Sed rate 7, CRP  1.   Review of labs in 2013: ANA direct neg. ANA IF 1:80 homogenous pattern,  ASMA neg, AMA neg, HCV Ab neg, Hep B surf Ag neg, ceruloplasmin 25.1, Hep B core total neg, Hep A total ab. Alpha 1 antitrypsin 120    Abd u/s 02/2023: IMPRESSION: 1. Increased hepatic echotexture, most commonly seen with steatosis. Correlation with LFT's is recommended. 2. Partially contracted gallbladder with sludge and possible tiny stones. No biliary dilatation.  Colonoscopy 04/2014: -colonic diverticulosis -colonoscopy 10 years    Medications   Current Outpatient Medications  Medication Sig Dispense Refill   amLODipine-benazepril (LOTREL) 10-20 MG per capsule Take 1 capsule by mouth daily.     atenolol (TENORMIN) 100 MG tablet Take 100 mg by mouth daily.     calcium-vitamin D (OSCAL WITH D) 500-200 MG-UNIT per tablet Take 1 tablet by mouth 2 (two) times daily.     estradiol (ESTRACE) 2 MG tablet Take 2 mg by mouth daily.     ferrous sulfate 325 (65 FE) MG tablet Take by mouth.     Krill Oil 1000 MG CAPS Take by mouth.     loperamide (IMODIUM A-D) 2 MG tablet Take 2 mg by mouth at bedtime.     loratadine (CLARITIN) 10 MG tablet Take by mouth.     spironolactone (ALDACTONE) 25 MG tablet Take 25 mg by mouth 2 (two) times daily.     SYNTHROID 88 MCG tablet Take by mouth.     triamcinolone (KENALOG) 0.1 % paste      zinc gluconate 50 MG tablet Take 50  mg by mouth daily.     No current facility-administered medications for this visit.    Allergies   Allergies as of 07/08/2023 - Review Complete 07/08/2023  Allergen Reaction Noted   Codeine Nausea Only 07/05/2015   Shellfish allergy Anaphylaxis 07/08/2023   Metoprolol tartrate  06/19/2015   Other  06/19/2015   Transderm-scop [scopolamine] Nausea And Vomiting 06/19/2015     Past Medical History   Past Medical History:  Diagnosis Date   Arthritis    Fatty liver    Heart rate fast    Hyperlipidemia    Hypertension    Hypothyroidism    PONV  (postoperative nausea and vomiting)     Past Surgical History   Past Surgical History:  Procedure Laterality Date   ABDOMINAL HYSTERECTOMY     BREAST BIOPSY Right 06/08/2013   neg- APOCRINE CYSTS   COLONOSCOPY N/A 04/14/2014   Procedure: COLONOSCOPY;  Surgeon: Suzette Espy, MD;  [facility]: [facility];  Service: Endoscopy;  Laterality: N/A;  11:30 AM   KNEE ARTHROSCOPY Right    KNEE ARTHROSCOPY Left 06/27/2015   Procedure: ARTHROSCOPY KNEE, Partial Medial Menisectomy, Chondral Debridement ;  Surgeon: Josephus Nida, MD;  Location: [facility] OR[facility];  Service: Orthopedics;  Laterality: Left;   TUBAL LIGATION      Past Family History   Family History  Problem Relation Age of Onset   Breast cancer Neg Hx    Celiac disease Neg Hx    Inflammatory bowel disease Neg Hx    Colon cancer Neg Hx     Past Social History   Social History   Socioeconomic History   Marital status: Divorced    Spouse name: Not on file   Number of children: Not on file   Years of education: Not on file   Highest education level: Not on file  Occupational History   Not on file  Tobacco Use   Smoking status: Never   Smokeless tobacco: Never  Substance and Sexual Activity   Alcohol use: No   Drug use: No   Sexual activity: Not Currently  Other Topics Concern   Not on file  Social History Narrative   Not on file   Social Drivers of Health   Financial Resource Strain: Low Risk  (06/11/2023)   Received from [facility]   Overall Financial Resource Strain (CARDIA)    Difficulty of Paying Living Expenses: Not hard at all  Food Insecurity: No Food Insecurity (06/11/2023)   Received from [facility]   Hunger Vital Sign    Worried About Running Out of Food in the Last Year: Never true    Ran Out of Food in the Last Year: Never true  Transportation Needs: No Transportation Needs (06/11/2023)   Received from [facility] - Transportation    In  the past 12 months, has lack of transportation kept you from medical appointments or from getting medications?: No    Lack of Transportation (Non-Medical): No  Physical Activity: Not on file  Stress: Not on file  Social Connections: Not on file  Intimate Partner Violence: Not on file    Review of Systems   General: Negative for anorexia, weight loss, fever, chills, fatigue, weakness. ENT: Negative for hoarseness, difficulty swallowing , nasal congestion. CV: Negative for chest pain, angina, palpitations, dyspnea on exertion, peripheral edema.  Respiratory: Negative for dyspnea at rest, dyspnea on exertion, cough, sputum, wheezing.  GI: See history of present illness.  GU:  Negative for dysuria, hematuria, urinary incontinence, urinary frequency, nocturnal urination.  Endo: Negative for unusual weight change.     Physical Exam   BP 132/63 (BP Location: Right Arm, Patient Position: Sitting, Cuff Size: Normal)   Pulse 60   Temp 98.2 F (36.8 C) (Oral)   Ht 5\' 1"  (1.549 m)   Wt 155 lb 6.4 oz (70.5 kg)   SpO2 97%   BMI 29.36 kg/m    General: Well-nourished, well-developed in no acute distress.  Eyes: No icterus. Mouth: Oropharyngeal mucosa moist and pink   Lungs: Clear to auscultation bilaterally.  Heart: Regular rate and rhythm, no murmurs rubs or gallops.  Abdomen: Bowel sounds are normal,  nondistended, no hepatosplenomegaly or masses,  no abdominal bruits or hernia , no rebound or guarding. Sore throughout abdomen Rectal: not performed  Extremities: No lower extremity edema. No clubbing or deformities. Neuro: Alert and oriented x 4   Skin: Warm and dry, no jaundice.   Psych: Alert and cooperative, normal mood and affect.  Labs   See hpi  Imaging Studies   NM Hepato W/EF Addendum Date: 06/26/2023 ADDENDUM REPORT: 06/26/2023 15:42 ADDENDUM: Patient received 1.4 mcg of CCK. Ensure was not utilized for calculation of ejection fraction for this study. Electronically Signed    By: Tama Fails M.D.   On: 06/26/2023 15:42   Result Date: 06/26/2023 CLINICAL DATA:  Right upper quadrant abdominal pain EXAM: NUCLEAR MEDICINE HEPATOBILIARY IMAGING WITH GALLBLADDER EF TECHNIQUE: Sequential images of the abdomen were obtained out to 60 minutes following intravenous administration of radiopharmaceutical. After oral ingestion of Ensure, gallbladder ejection fraction was determined. At 60 min, normal ejection fraction is greater than 33%. RADIOPHARMACEUTICALS:  5.0 mCi Tc-38m  Choletec  IV COMPARISON:  CT April 01, 2023 FINDINGS: Prompt uptake and biliary excretion of activity by the liver is seen. Gallbladder activity is visualized, consistent with patency of cystic duct. Biliary activity passes into small bowel, consistent with patent common bile duct. Calculated gallbladder ejection fraction is 28%. (Normal gallbladder ejection fraction with Ensure is greater than 33% and less than 80%.) IMPRESSION: Reduced gallbladder ejection fraction as can be seen with chronic cholecystitis/biliary dyskinesia. Electronically Signed: By: Tama Fails M.D. On: 06/25/2023 14:50   US  PELVIC COMPLETE WITH TRANSVAGINAL Result Date: 06/10/2023 Images from the original result were not included.  ..an Financial trader of Ultrasound Medicine Technical sales engineer) accredited practice Center for [facility] [address] [place] [address] Ordering Provider: Keene Pastures, DO GYNECOLOGIC SONOGRAM Samantha Hamilton is a 73 y.o. G2P2 No LMP recorded. Patient has had a hysterectomy. for a pelvic sonogram for Rt ov cyst seen on CAT scan 04/01/23 measuring 41 x 37 mm. Right ovary             4.1 x 2.9 x 4.1 cm, not mobile The right ovary is seen with several avascular simple echofree cysts, thin walls, smoothly marginated measuring 35 x 26 mm, 17 x 4 mm and 8 x 6 mm,  The largest simple cyst does not appear to have changed compared to CAT scan Feb. 26, 2025 Left ovary                1.7 x 1.6 x 2.8  cm, not mobile  The Left ovary is also seen with multiple small simple echofree avascular cysts measuring 14 x 9 mm, 10 x 6 mm and 6 x 3 mm Neg adnexal regions   -    neg CDS, no  free fluid present Technician Comments: GYN US : TA and TV imaging performed - vinyl probe cover used - Chaperone: Keila Hx Tubal Ligation 1987   Hx Hyst in 1989 Vaginal cuff and surrounding region - The ovaries are not mobile, both seen close to midline of pelvis.  The right ovary is seen with several avascular simple echofree cysts, thin walls, smoothly marginated measuring 35 x 26 mm, 17 x 4 mm and 8 x 6 mm.  The Left ovary is also seen with multiple small simple echofree avascular cysts measuring 14 x 9 mm, 10 x 6 mm and 6 x 3 mm  The adnexal regions appear neg, neg CDS, no free fluid present Brien Can 06/10/2023 12:22 PM Clinical Impression and recommendations: I have reviewed the sonogram results above, combined with the patient's current clinical course, below are my impressions and any appropriate recommendations for management based on the sonographic findings. Uterus and cervix surgically absent Right ovary with 3.5 x 2.6 cm unilocular anechoic smoothly marginated cyst.  Normal appearing left ovary. Smaller cysts seen within both ovaries that are consider physiologic. Based on the US  findings, O-RADS Cat 2- Almost certainly benign Recommend follow up ultrasound in 1 yr (O-RADS US  Risk Stratification and Management System: A Consensus Guidelines from the ACR Ovarian-Adnexal Reporting and Data System, Radiology 2020; [phone]) Jennifer Ozan, DO Attending Obstetrician & Gynecologist, Faculty Practice Center for [facility], [facility]    Assessment   Upper abdominal pain, N/V: postprandial, does not matter what she eats. U/s with sludge and possible tiny cholelithiasis. HIDA with slightly low GBEF and cramping abdominal pain and nausea with CCK.  -await colonoscopy findings -consider surgical  referral if no other explanation for symptoms.  -consider liver biopsy at time of cholecystectomy (if necessary) for staging of fatty liver.     Change in bowel habits/diarrhea -going on for few years, using imodium twice a day -last colonoscopy 2016 -inflammatory markers, celiac serologies negative -colonoscopy with random colon biopsies. ASA 2.  I have discussed the risks, alternatives, benefits with regards to but not limited to the risk of reaction to medication, bleeding, infection, perforation and the patient is agreeable to proceed. Written consent to be obtained. -patient asked to wait until 06/2023 for colonoscopy due to tax season   Fatty liver: -dates back to at least 2013, extensive work up for elevated LFTs at that time. ANA + as outlined.  -recommended to update serologies, noninvasive fibrosis staging, elastography in 09/2023 -return ov in 09/2023        Trudie Fuse. Harles Lied, MHS, PA-C [facility]

## 2023-07-08 NOTE — Patient Instructions (Signed)
 We will be in touch once your colonoscopy is completed.   Let me know if you have any further episodes of vomiting.

## 2023-07-13 ENCOUNTER — Encounter (HOSPITAL_COMMUNITY)
Admission: RE | Disposition: A | Discharge status: Home / Self Care | Payer: Self-pay | Source: Home / Self Care | Attending: Internal Medicine

## 2023-07-13 ENCOUNTER — Ambulatory Visit (HOSPITAL_COMMUNITY): Admitting: Anesthesiology

## 2023-07-13 ENCOUNTER — Other Ambulatory Visit: Payer: Self-pay

## 2023-07-13 ENCOUNTER — Encounter (HOSPITAL_COMMUNITY): Payer: Self-pay | Admitting: Internal Medicine

## 2023-07-13 ENCOUNTER — Ambulatory Visit (HOSPITAL_COMMUNITY)
Admission: RE | Admit: 2023-07-13 | Discharge: 2023-07-13 | Disposition: A | Discharge status: Home / Self Care | Attending: Internal Medicine | Admitting: Internal Medicine

## 2023-07-13 DIAGNOSIS — K5730 Diverticulosis of large intestine without perforation or abscess without bleeding: Secondary | ICD-10-CM | POA: Insufficient documentation

## 2023-07-13 DIAGNOSIS — D123 Benign neoplasm of transverse colon: Secondary | ICD-10-CM

## 2023-07-13 DIAGNOSIS — M1990 Unspecified osteoarthritis, unspecified site: Secondary | ICD-10-CM | POA: Diagnosis not present

## 2023-07-13 DIAGNOSIS — E039 Hypothyroidism, unspecified: Secondary | ICD-10-CM | POA: Diagnosis not present

## 2023-07-13 DIAGNOSIS — Z79899 Other long term (current) drug therapy: Secondary | ICD-10-CM | POA: Diagnosis not present

## 2023-07-13 DIAGNOSIS — K760 Fatty (change of) liver, not elsewhere classified: Secondary | ICD-10-CM | POA: Diagnosis not present

## 2023-07-13 DIAGNOSIS — I10 Essential (primary) hypertension: Secondary | ICD-10-CM | POA: Insufficient documentation

## 2023-07-13 DIAGNOSIS — K529 Noninfective gastroenteritis and colitis, unspecified: Secondary | ICD-10-CM | POA: Insufficient documentation

## 2023-07-13 HISTORY — PX: COLONOSCOPY: SHX5424

## 2023-07-13 SURGERY — COLONOSCOPY
Anesthesia: General

## 2023-07-13 MED ORDER — PROPOFOL 10 MG/ML IV BOLUS
INTRAVENOUS | Status: DC | PRN
  Administered 2023-07-13: 100 mg via INTRAVENOUS

## 2023-07-13 MED ORDER — LACTATED RINGERS IV SOLN: INTRAVENOUS | Status: DC

## 2023-07-13 MED ORDER — LACTATED RINGERS IV SOLN: INTRAVENOUS | Status: DC | PRN

## 2023-07-13 MED ORDER — PROPOFOL 500 MG/50ML IV EMUL
INTRAVENOUS | Status: DC | PRN
  Administered 2023-07-13: 200 ug/kg/min via INTRAVENOUS

## 2023-07-13 NOTE — Anesthesia Postprocedure Evaluation (Signed)
 Anesthesia Post Note  Patient: Samantha Hamilton  Procedure(s) Performed: COLONOSCOPY  Patient location during evaluation: Endoscopy Anesthesia Type: General Level of consciousness: awake and alert Pain management: pain level controlled Vital Signs Assessment: post-procedure vital signs reviewed and stable Respiratory status: spontaneous breathing, nonlabored ventilation, respiratory function stable and patient connected to nasal cannula oxygen Cardiovascular status: blood pressure returned to baseline and stable Postop Assessment: no apparent nausea or vomiting Anesthetic complications: no   There were no known notable events for this encounter.   Last Vitals:  Vitals:   07/13/23 0753 07/13/23 0947  BP: 136/60 (!) 107/40  Pulse: 61 65  Resp:  13  Temp: 36.7 C (!) 36.4 C  SpO2: 98% 97%    Last Pain:  Vitals:   07/13/23 0947  TempSrc: Oral  PainSc: 0-No pain                 [name] [name]

## 2023-07-13 NOTE — Discharge Instructions (Signed)
  Colonoscopy Discharge Instructions  Read the instructions outlined below and refer to this sheet in the next few weeks. These discharge instructions provide you with general information on caring for yourself after you leave the hospital. Your doctor may also give you specific instructions. While your treatment has been planned according to the most current medical practices available, unavoidable complications occasionally occur. If you have any problems or questions after discharge, call Dr. Riley Cheadle at [phone]. ACTIVITY You may resume your regular activity, but move at a slower pace for the next 24 hours.  Take frequent rest periods for the next 24 hours.  Walking will help get rid of the air and reduce the bloated feeling in your belly (abdomen).  No driving for 24 hours (because of the medicine (anesthesia) used during the test).   Do not sign any important legal documents or operate any machinery for 24 hours (because of the anesthesia used during the test).  NUTRITION Drink plenty of fluids.  You may resume your normal diet as instructed by your doctor.  Begin with a light meal and progress to your normal diet. Heavy or fried foods are harder to digest and may make you feel sick to your stomach (nauseated).  Avoid alcoholic beverages for 24 hours or as instructed.  MEDICATIONS You may resume your normal medications unless your doctor tells you otherwise.  WHAT YOU CAN EXPECT TODAY Some feelings of bloating in the abdomen.  Passage of more gas than usual.  Spotting of blood in your stool or on the toilet paper.  IF YOU HAD POLYPS REMOVED DURING THE COLONOSCOPY: No aspirin products for 7 days or as instructed.  No alcohol for 7 days or as instructed.  Eat a soft diet for the next 24 hours.  FINDING OUT THE RESULTS OF YOUR TEST Not all test results are available during your visit. If your test results are not back during the visit, make an appointment with your caregiver to find out the  results. Do not assume everything is normal if you have not heard from your caregiver or the medical facility. It is important for you to follow up on all of your test results.  SEEK IMMEDIATE MEDICAL ATTENTION IF: You have more than a spotting of blood in your stool.  Your belly is swollen (abdominal distention).  You are nauseated or vomiting.  You have a temperature over 101.  You have abdominal pain or discomfort that is severe or gets worse throughout the day.     1 polyp found and removed.  Diverticulosis information provided  Segmental biopsies taken to assess for diarrhea  Further recommendations to follow pending review of pathology report

## 2023-07-13 NOTE — Transfer of Care (Signed)
 Immediate Anesthesia Transfer of Care Note  Patient: Samantha Hamilton  Procedure(s) Performed: COLONOSCOPY  Patient Location: Endoscopy Unit  Anesthesia Type:General  Level of Consciousness: awake, alert , oriented, and patient cooperative  Airway & Oxygen Therapy: Patient Spontanous Breathing  Post-op Assessment: Report given to RN, Post -op Vital signs reviewed and stable, and Patient moving all extremities X 4  Post vital signs: Reviewed and stable  Last Vitals:  Vitals Value Taken Time  BP 107/40 07/13/23 0947  Temp 36.4 C 07/13/23 0947  Pulse 65 07/13/23 0947  Resp 13 07/13/23 0947  SpO2 97 % 07/13/23 0947    Last Pain:  Vitals:   07/13/23 0947  TempSrc: Oral  PainSc: 0-No pain      Patients Stated Pain Goal: 5 (07/13/23 0753)  Complications: No notable events documented.

## 2023-07-13 NOTE — Anesthesia Preprocedure Evaluation (Addendum)
 Anesthesia Evaluation  Patient identified by MRN, date of birth, ID band Patient awake    Reviewed: Allergy & Precautions, H&P , NPO status , Patient's Chart, lab work & pertinent test results  History of Anesthesia Complications (+) PONV and history of anesthetic complications  Airway Mallampati: III  TM Distance: <3 FB Neck ROM: full    Dental  (+) Poor Dentition, Chipped, Caps All front teeth are capped:   Pulmonary neg pulmonary ROS, neg shortness of breath   Pulmonary exam normal breath sounds clear to auscultation       Cardiovascular Exercise Tolerance: Good hypertension, (-) angina (-) Past MI Normal cardiovascular exam+ dysrhythmias  Rhythm:regular Rate:Normal  Fast heart rate   Neuro/Psych negative neurological ROS  negative psych ROS   GI/Hepatic negative GI ROS,neg GERD  ,,Fatty liver   Endo/Other  Hypothyroidism    Renal/GU negative Renal ROS  negative genitourinary   Musculoskeletal  (+) Arthritis , Osteoarthritis,    Abdominal   Peds  Hematology negative hematology ROS (+)   Anesthesia Other Findings Past Medical History:   Hypertension                                                 Heart rate fast                                              Arthritis                                                    PONV (postoperative nausea and vomiting)                    Past Surgical History:   ABDOMINAL HYSTERECTOMY                                        KNEE ARTHROSCOPY                                Right              COLONOSCOPY                                     N/A 04/14/2014      Comment:Procedure: COLONOSCOPY;  Surgeon: Suzette Espy, MD;  Location: [facility];  Service:               Endoscopy;  Laterality: N/A;  11:30 AM   BREAST BIOPSY                                   Right 06/08/13      BMI    Body Mass Index  31.63 kg/m 2      Reproductive/Obstetrics negative OB  ROS                             Anesthesia Physical Anesthesia Plan  ASA: 3  Anesthesia Plan: General   Post-op Pain Management: Minimal or no pain anticipated   Induction: Intravenous  PONV Risk Score and Plan: Propofol  infusion  Airway Management Planned: Nasal Cannula and Natural Airway  Additional Equipment:   Intra-op Plan:   Post-operative Plan:   Informed Consent: I have reviewed the patients History and Physical, chart, labs and discussed the procedure including the risks, benefits and alternatives for the proposed anesthesia with the patient or authorized representative who has indicated his/her understanding and acceptance.     Dental Advisory Given  Plan Discussed with: CRNA  Anesthesia Plan Comments:         Anesthesia Quick Evaluation

## 2023-07-13 NOTE — Interval H&P Note (Signed)
 History and Physical Interval Note:  07/13/2023 9:09 AM  Samantha Hamilton  has presented today for surgery, with the diagnosis of Change in bowel habits/diarrhea.  The various methods of treatment have been discussed with the patient and family. After consideration of risks, benefits and other options for treatment, the patient has consented to  Procedure(s) with comments: COLONOSCOPY (N/A) - 9:00 AM, ASA 2 as a surgical intervention.  The patient's history has been reviewed, patient examined, no change in status, stable for surgery.  I have reviewed the patient's chart and labs.  Questions were answered to the patient's satisfaction.     Gaylon Bentz     no change.  Diagnostic colonoscopy chronic diarrhea random biopsies plan. The risks, benefits, limitations, alternatives and imponderables have been reviewed with the patient. Questions have been answered. All parties are agreeable.

## 2023-07-13 NOTE — Op Note (Signed)
 [facility] Patient Name: Samantha Hamilton Procedure Date: 07/13/2023 9:03 AM MRN: [id number] Date of Birth: [birth year] Attending MD: Gemma Kelp , MD, [id number] CSN: [id number] Age: 73 Admit Type: Outpatient Procedure:                Colonoscopy Indications:              Chronic diarrhea Providers:                Gemma Kelp, MD, Vonna Guardian, Theola Fitch Referring MD:              Medicines:                Propofol  per Anesthesia Complications:            No immediate complications. Estimated Blood Loss:     Estimated blood loss was minimal. Procedure:                Pre-Anesthesia Assessment:                           - Prior to the procedure, a History and Physical                            was performed, and patient medications and                            allergies were reviewed. The patient's tolerance of                            previous anesthesia was also reviewed. The risks                            and benefits of the procedure and the sedation                            options and risks were discussed with the patient.                            All questions were answered, and informed consent                            was obtained. Prior Anticoagulants: The patient has                            taken no anticoagulant or antiplatelet agents. ASA                            Grade Assessment: III - A patient with severe                            systemic disease. After reviewing the risks and                            benefits, the patient was deemed in satisfactory  condition to undergo the procedure.                           After obtaining informed consent, the colonoscope                            was passed under direct vision. Throughout the                            procedure, the patient's blood pressure, pulse, and                            oxygen saturations were monitored continuously. The                             [id number]) scope was introduced through                            the anus and advanced to the 5 cm into the ileum.                            The colonoscopy was performed without difficulty.                            The patient tolerated the procedure well. The                            quality of the bowel preparation was adequate. The                            terminal ileum, ileocecal valve, appendiceal                            orifice, and rectum were photographed. Scope In: 9:25:47 AM Scope Out: 9:42:37 AM Scope Withdrawal Time: 0 hours 6 minutes 55 seconds  Total Procedure Duration: 0 hours 16 minutes 50 seconds  Findings:      The perianal and digital rectal examinations were normal.      Scattered medium-mouthed diverticula were found in the sigmoid colon and       descending colon.      A 6 mm polyp was found in the mid transverse colon. The polyp was       semi-pedunculated. The polyp was removed with a cold snare. Resection       and retrieval were complete.      The exam was otherwise without abnormality on direct and retroflexion       views. Impression:               - Diverticulosis in the sigmoid colon and in the                            descending colon. Normal distal 5 cm of TI                           - One 6 mm polyp in the mid  transverse colon,                            removed with a cold snare. Resected and retrieved.                           - The examination was otherwise normal on direct                            and retroflexion views. Segmental biopsies of the                            right and left colon taken for histologic study Moderate Sedation:      Moderate (conscious) sedation was personally administered by an       anesthesia professional. The following parameters were monitored: oxygen       saturation, heart rate, blood pressure, respiratory rate, EKG, adequacy       of pulmonary ventilation, and response to  care. Recommendation:           - Repeat colonoscopy date to be determined after                            pending pathology results are reviewed for                            surveillance.                           - Return to GI office after studies are complete. Procedure Code(s):        --- Professional ---                           [phone], Colonoscopy, flexible; with removal of                            tumor(s), polyp(s), or other lesion(s) by snare                            technique Diagnosis Code(s):        --- Professional ---                           D12.3, Benign neoplasm of transverse colon (hepatic                            flexure or splenic flexure)                           K52.9, Noninfective gastroenteritis and colitis,                            unspecified                           K57.30, Diverticulosis of large intestine without  perforation or abscess without bleeding CPT copyright 2022 American Medical Association. All rights reserved. The codes documented in this report are preliminary and upon coder review may  be revised to meet current compliance requirements. Windsor Hatcher. Arville Postlewaite, MD Gemma Kelp, MD 07/13/2023 9:50:34 AM This report has been signed electronically. Number of Addenda: 0

## 2023-07-14 ENCOUNTER — Encounter (HOSPITAL_COMMUNITY): Payer: Self-pay | Admitting: Internal Medicine

## 2023-07-14 LAB — SURGICAL PATHOLOGY

## 2023-07-15 ENCOUNTER — Ambulatory Visit: Payer: Self-pay | Admitting: Internal Medicine

## 2023-07-21 ENCOUNTER — Encounter: Payer: Self-pay | Admitting: Family Medicine

## 2023-07-23 ENCOUNTER — Other Ambulatory Visit: Payer: Self-pay | Admitting: Family Medicine

## 2023-07-23 DIAGNOSIS — R921 Mammographic calcification found on diagnostic imaging of breast: Secondary | ICD-10-CM

## 2023-09-01 ENCOUNTER — Other Ambulatory Visit: Payer: Self-pay

## 2023-09-01 DIAGNOSIS — R7401 Elevation of levels of liver transaminase levels: Secondary | ICD-10-CM

## 2023-09-01 DIAGNOSIS — R1031 Right lower quadrant pain: Secondary | ICD-10-CM

## 2023-09-01 DIAGNOSIS — D509 Iron deficiency anemia, unspecified: Secondary | ICD-10-CM

## 2023-09-01 DIAGNOSIS — R921 Mammographic calcification found on diagnostic imaging of breast: Secondary | ICD-10-CM

## 2023-09-01 DIAGNOSIS — K760 Fatty (change of) liver, not elsewhere classified: Secondary | ICD-10-CM

## 2023-09-01 DIAGNOSIS — R1011 Right upper quadrant pain: Secondary | ICD-10-CM

## 2023-09-02 ENCOUNTER — Encounter: Payer: Self-pay | Admitting: Gastroenterology

## 2023-09-02 ENCOUNTER — Other Ambulatory Visit: Payer: Self-pay | Admitting: *Deleted

## 2023-09-02 ENCOUNTER — Encounter: Payer: Self-pay | Admitting: *Deleted

## 2023-09-02 ENCOUNTER — Telehealth: Payer: Self-pay

## 2023-09-02 DIAGNOSIS — K760 Fatty (change of) liver, not elsewhere classified: Secondary | ICD-10-CM

## 2023-09-02 NOTE — Telephone Encounter (Signed)
 Samantha Hamilton, please advise US  needed. Is it RUQ/complete with elastography or just US  elastography?

## 2023-09-02 NOTE — Progress Notes (Signed)
 Error

## 2023-09-02 NOTE — Telephone Encounter (Signed)
 Recall letter for ultrasound was mailed out yesterday. Pt is ready to schedule ultrasound and was advised to go to lab to have bloodwork on the same day. Pt verbalized understanding.

## 2023-09-02 NOTE — Telephone Encounter (Signed)
 Message sent via mychart regarding US  appointment

## 2023-09-02 NOTE — Telephone Encounter (Signed)
 LMOVM to return call  US  scheduled for 09/16/23, arrive at 8:15 am to check in, NPO after midnight.

## 2023-09-02 NOTE — Telephone Encounter (Signed)
 Ruq u/s with elastography is fine.

## 2023-09-02 NOTE — Addendum Note (Signed)
 Addended by: JEANELL GRAEME RAMAN on: 09/02/2023 03:10 PM   Modules accepted: Orders

## 2023-09-07 NOTE — Telephone Encounter (Signed)
 LMOVM to return call.

## 2023-09-08 NOTE — Telephone Encounter (Signed)
 Pt informed of US  appointment

## 2023-09-15 ENCOUNTER — Ambulatory Visit
Admission: RE | Admit: 2023-09-15 | Discharge: 2023-09-15 | Disposition: A | Discharge status: Home / Self Care | Source: Ambulatory Visit | Attending: Family Medicine | Admitting: Family Medicine

## 2023-09-15 DIAGNOSIS — R921 Mammographic calcification found on diagnostic imaging of breast: Secondary | ICD-10-CM | POA: Insufficient documentation

## 2023-09-16 ENCOUNTER — Ambulatory Visit (HOSPITAL_COMMUNITY)

## 2023-09-16 ENCOUNTER — Ambulatory Visit (HOSPITAL_COMMUNITY)
Admission: RE | Admit: 2023-09-16 | Discharge: 2023-09-16 | Disposition: A | Discharge status: Home / Self Care | Source: Ambulatory Visit | Attending: Gastroenterology | Admitting: Gastroenterology

## 2023-09-16 DIAGNOSIS — K760 Fatty (change of) liver, not elsewhere classified: Secondary | ICD-10-CM | POA: Diagnosis present

## 2023-09-18 LAB — CBC WITH DIFFERENTIAL/PLATELET
Basophils Absolute: 0.1 x10E3/uL (ref 0.0–0.2)
Basos: 1 %
EOS (ABSOLUTE): 0.3 x10E3/uL (ref 0.0–0.4)
Eos: 5 %
Hematocrit: 40.3 % (ref 34.0–46.6)
Hemoglobin: 13.1 g/dL (ref 11.1–15.9)
Immature Grans (Abs): 0 x10E3/uL (ref 0.0–0.1)
Immature Granulocytes: 0 %
Lymphocytes Absolute: 2.1 x10E3/uL (ref 0.7–3.1)
Lymphs: 35 %
MCH: 31 pg (ref 26.6–33.0)
MCHC: 32.5 g/dL (ref 31.5–35.7)
MCV: 96 fL (ref 79–97)
Monocytes Absolute: 0.4 x10E3/uL (ref 0.1–0.9)
Monocytes: 7 %
Neutrophils Absolute: 3.1 x10E3/uL (ref 1.4–7.0)
Neutrophils: 52 %
Platelets: 263 x10E3/uL (ref 150–450)
RBC: 4.22 x10E6/uL (ref 3.77–5.28)
RDW: 12.1 % (ref 11.7–15.4)
WBC: 5.9 x10E3/uL (ref 3.4–10.8)

## 2023-09-18 LAB — IRON,TIBC AND FERRITIN PANEL
Ferritin: 124 ng/mL (ref 15–150)
Iron Saturation: 32 % (ref 15–55)
Iron: 90 ug/dL (ref 27–139)
Total Iron Binding Capacity: 277 ug/dL (ref 250–450)
UIBC: 187 ug/dL (ref 118–369)

## 2023-09-18 LAB — COMPREHENSIVE METABOLIC PANEL WITH GFR
ALT: 17 IU/L (ref 0–32)
AST: 19 IU/L (ref 0–40)
Albumin: 4.1 g/dL (ref 3.8–4.8)
Alkaline Phosphatase: 44 IU/L (ref 44–121)
BUN/Creatinine Ratio: 16 (ref 12–28)
BUN: 14 mg/dL (ref 8–27)
Bilirubin Total: 0.4 mg/dL (ref 0.0–1.2)
CO2: 24 mmol/L (ref 20–29)
Calcium: 9.4 mg/dL (ref 8.7–10.3)
Chloride: 102 mmol/L (ref 96–106)
Creatinine, Ser: 0.9 mg/dL (ref 0.57–1.00)
Globulin, Total: 2.5 g/dL (ref 1.5–4.5)
Glucose: 91 mg/dL (ref 70–99)
Potassium: 4.5 mmol/L (ref 3.5–5.2)
Sodium: 139 mmol/L (ref 134–144)
Total Protein: 6.6 g/dL (ref 6.0–8.5)
eGFR: 67 mL/min/1.73 (ref >59)

## 2023-09-18 LAB — NASH FIBROSURE(R) PLUS
ALPHA 2-MACROGLOBULINS, QN: 347 mg/dL — ABNORMAL HIGH (ref 110–276)
ALT (SGPT) P5P: 18 IU/L (ref 0–40)
AST (SGOT) P5P: 27 IU/L (ref 0–40)
Apolipoprotein A-1: 163 mg/dL (ref 116–209)
Bilirubin, Total: 0.3 mg/dL (ref 0.0–1.2)
Cholesterol, Total: 218 mg/dL — ABNORMAL HIGH (ref 100–199)
Fibrosis Score: 0.37 — ABNORMAL HIGH (ref 0.00–0.21)
GGT: 9 IU/L (ref 0–60)
Glucose: 100 mg/dL — ABNORMAL HIGH (ref 70–99)
Haptoglobin: 71 mg/dL (ref 42–346)
NASH Score: 0 (ref 0.00–0.25)
Steatosis Score: 0.28 (ref 0.00–0.40)
Triglycerides: 153 mg/dL — ABNORMAL HIGH (ref 0–149)

## 2023-09-18 LAB — PROTIME-INR
INR: 1 (ref 0.9–1.2)
Prothrombin Time: 10.5 s (ref 9.1–12.0)

## 2023-09-18 LAB — HEPATITIS B SURFACE ANTIBODY,QUALITATIVE: Hep B Surface Ab, Qual: NONREACTIVE

## 2023-09-18 LAB — IGG, IGA, IGM
IgA/Immunoglobulin A, Serum: 247 mg/dL (ref 64–422)
IgG (Immunoglobin G), Serum: 1273 mg/dL (ref 586–1602)
IgM (Immunoglobulin M), Srm: 60 mg/dL (ref 26–217)

## 2023-09-18 LAB — ENHANCED LIVER FIBROSIS (ELF): ELF(TM) Score: 10.79 — ABNORMAL HIGH (ref <9.80)

## 2023-09-18 LAB — ANTINUCLEAR ANTIBODIES, IFA: ANA Titer 1: NEGATIVE

## 2023-09-18 LAB — HEPATITIS A ANTIBODY, TOTAL: hep A Total Ab: POSITIVE — AB

## 2023-09-21 ENCOUNTER — Other Ambulatory Visit: Payer: Self-pay | Admitting: Gastroenterology

## 2023-09-21 DIAGNOSIS — K760 Fatty (change of) liver, not elsewhere classified: Secondary | ICD-10-CM

## 2023-09-28 ENCOUNTER — Ambulatory Visit: Payer: Self-pay | Admitting: Gastroenterology

## 2023-10-01 ENCOUNTER — Ambulatory Visit: Payer: Self-pay | Admitting: Gastroenterology

## 2023-11-13 ENCOUNTER — Encounter: Payer: Self-pay | Admitting: Internal Medicine

## 2023-11-13 ENCOUNTER — Ambulatory Visit: Admitting: Internal Medicine

## 2023-11-13 VITALS — BP 136/63 | HR 58 | Temp 98.1°F | Ht 62.0 in | Wt 160.2 lb

## 2023-11-13 DIAGNOSIS — Z860101 Personal history of adenomatous and serrated colon polyps: Secondary | ICD-10-CM

## 2023-11-13 DIAGNOSIS — K760 Fatty (change of) liver, not elsewhere classified: Secondary | ICD-10-CM | POA: Diagnosis not present

## 2023-11-13 DIAGNOSIS — R197 Diarrhea, unspecified: Secondary | ICD-10-CM

## 2023-11-13 DIAGNOSIS — R112 Nausea with vomiting, unspecified: Secondary | ICD-10-CM

## 2023-11-13 NOTE — Progress Notes (Signed)
 Gastroenterology Progress Note    Primary Care Physician:  Stanton Lynwood FALCON, MD Primary Gastroenterologist:  Dr. Shaaron  Pre-Procedure History & Physical: HPI:  Samantha Hamilton is a 73 y.o. female here for follow-up fatty appearing liver on ultrasound.  Continues to have biliary like attacks has had 8 this year afraid to proceed with cholecystectomy thinking it may make her liver disease worse and give her diarrhea.  Does have intermittent postprandial diarrhea  - has had it for 5 years; colonoscopy negative for microscopic colitis on biopsy.  Small adenoma removed no future colonoscopy recommended. Discordinate noninvasive fibrosis scores ANA negative AMA negative most recent LFTs completely normal.  KP A4 on elastography-high quality,  ELF elevated above 10 implying F2 F3 fibrosis.  Not diabetic she does not drink. Immune to hepatitis A; hepatitis B vaccine recommended-not obtained yet. Remittent nonbloody diarrhea.  May have a couple of days where she has no diarrhea takes Imodium AD 1 at bedtime sometimes does not take it and she tends to have diarrhea the next day.   Past Medical History:  Diagnosis Date   Arthritis    Fatty liver    Heart rate fast    Hyperlipidemia    Hypertension    Hypothyroidism    PONV (postoperative nausea and vomiting)     Past Surgical History:  Procedure Laterality Date   ABDOMINAL HYSTERECTOMY     BREAST BIOPSY Right 06/08/2013   neg- APOCRINE CYSTS   COLONOSCOPY N/A 04/14/2014   Procedure: COLONOSCOPY;  Surgeon: Lamar CHRISTELLA Shaaron, MD;  Location: [facility];  Service: Endoscopy;  Laterality: N/A;  11:30 AM   COLONOSCOPY N/A 07/13/2023   Procedure: COLONOSCOPY;  Surgeon: Shaaron Lamar CHRISTELLA, MD;  Location: [facility];  Service: Endoscopy;  Laterality: N/A;  9:00 AM, ASA 2   KNEE ARTHROSCOPY Right    KNEE ARTHROSCOPY Left 06/27/2015   Procedure: ARTHROSCOPY KNEE, Partial Medial Menisectomy, Chondral Debridement ;  Surgeon: Helayne Glenn, MD;   Location: [facility] ORS;  Service: Orthopedics;  Laterality: Left;   TUBAL LIGATION      Prior to Admission medications   Medication Sig Start Date End Date Taking? Authorizing Provider  amLODipine-benazepril (LOTREL) 10-20 MG per capsule Take 1 capsule by mouth daily. 05/16/13  Yes [provider]  atenolol (TENORMIN) 100 MG tablet Take 100 mg by mouth daily.   Yes [provider]  calcium-vitamin D (OSCAL WITH D) 500-200 MG-UNIT per tablet Take 1 tablet by mouth 2 (two) times daily.   Yes [provider]  estradiol (ESTRACE) 2 MG tablet Take 2 mg by mouth daily. 05/02/23  Yes [provider]  ferrous sulfate 325 (65 FE) MG tablet Take by mouth.   Yes [provider]  Anselm Oil 1000 MG CAPS Take by mouth.   Yes [provider]  loperamide (IMODIUM A-D) 2 MG tablet Take 2 mg by mouth at bedtime.   Yes [provider]  loratadine (CLARITIN) 10 MG tablet Take by mouth.   Yes [provider]  spironolactone (ALDACTONE) 25 MG tablet Take 25 mg by mouth 2 (two) times daily.   Yes [provider]  SYNTHROID 88 MCG tablet Take by mouth. 12/30/22  Yes [provider]  triamcinolone (KENALOG) 0.1 % paste  09/14/15  Yes [provider]  zinc gluconate 50 MG tablet Take 50 mg by mouth daily.   Yes [provider]    Allergies as of 11/13/2023 - Review Complete 11/13/2023  Allergen Reaction Noted   Codeine Nausea Only 07/05/2015   Shellfish allergy Anaphylaxis 07/08/2023   Metoprolol tartrate  06/19/2015   Other  06/19/2015   Transderm-scop [scopolamine] Nausea And Vomiting 06/19/2015    Family History  Problem Relation Age of Onset   Breast cancer Neg Hx    Celiac disease Neg Hx    Inflammatory bowel disease Neg Hx    Colon cancer Neg Hx     Social History   Socioeconomic History   Marital status: Divorced    Spouse name: Not on file   Number of children: Not on file   Years of  education: Not on file   Highest education level: Not on file  Occupational History   Not on file  Tobacco Use   Smoking status: Never   Smokeless tobacco: Never  Substance and Sexual Activity   Alcohol use: No   Drug use: No   Sexual activity: Not Currently  Other Topics Concern   Not on file  Social History Narrative   Not on file   Social Drivers of Health   Financial Resource Strain: Low Risk  (06/11/2023)   Received from [facility]   Overall Financial Resource Strain (CARDIA)    Difficulty of Paying Living Expenses: Not hard at all  Food Insecurity: No Food Insecurity (06/11/2023)   Received from [facility]   Hunger Vital Sign    Within the past 12 months, you worried that your food would run out before you got the money to buy more.: Never true    Within the past 12 months, the food you bought just didn't last and you didn't have money to get more.: Never true  Transportation Needs: No Transportation Needs (06/11/2023)   Received from [facility] - Transportation    In the past 12 months, has lack of transportation kept you from medical appointments or from getting medications?: No    Lack of Transportation (Non-Medical): No  Physical Activity: Not on file  Stress: Not on file  Social Connections: Not on file  Intimate Partner Violence: Not on file    Review of Systems   See HPI, otherwise negative ROS  Physical Exam: BP 136/63 (BP Location: Right Arm, Patient Position: Sitting, Cuff Size: Normal)   Pulse (!) 58   Temp 98.1 F (36.7 C) (Oral)   Ht 5' 2 (1.575 m)   Wt 160 lb 3.2 oz (72.7 kg)   SpO2 97%   BMI 29.30 kg/m  General:   Alert,  Well-developed, well-nourished, pleasant and cooperative in NAD Neck:  Supple; no masses or thyromegaly. No significant cervical adenopathy. Lungs:  Clear throughout to auscultation.   No wheezes, crackles, or rhonchi. No acute distress. Heart:  Regular rate and  rhythm; no murmurs, clicks, rubs,  or gallops. Abdomen: Non-distended, normal bowel sounds.  Soft and nontender without appreciable mass or hepatosplenomegaly.   Impression/Plan:   73 year old lady with steatotic liver disease with normal LFTs.  Fairly extensive workup previously failed reveal any other cause.  Noninvasive fibrosis study somewhat discordant.  Elastography PKa evaluate may be more valid.  Normal LFTs is reassuring.  She has intermittent biliary colic.  I have offered to send her to a Careers adviser.  She wants to hold off and see how things go  Intermittent diarrhea likely related to IBS with negative colon biopsies and a negative celiac screen  Susceptible to hepatitis B  Recommendations:  Pursue hepatitis B vaccine  as previously recommended  If further biliary attacks and you are interested in pursuing cholecystectomy we will do that.  We will asked the surgeon to certainly do a liver biopsy at the time of gallbladder removal.  Regular exercise; strive for an ideal BMI  Repeat LFTs in 6 months  Will consider repeating noninvasive fibrosis assessment test in the future-to be determined  For diarrhea, take Imodium A-D 2 mg every day to prevent diarrhea  See back in the office in 6 months if you have problems in the interim please let me know     Notice: This dictation was prepared with Dragon dictation along with smaller phrase technology. Any transcriptional errors that result from this process are unintentional and may not be corrected upon review.

## 2023-11-13 NOTE — Patient Instructions (Signed)
 It was good to see you again today   Pursue hepatitis B vaccine as previously recommended  If further biliary attacks and you are not interested in pursuing cholecystectomy we will do that.  We will asked the surgeon to certainly do a liver biopsy at the time of gallbladder removal.  Regular exercise; strive for an ideal BMI  Repeat LFTs in 6 months  Will consider repeating noninvasive fibrosis assessment test in the future-to be determined  For diarrhea, take Imodium A-D 2 mg every day to prevent diarrhea  See back in the office in 6 months if you have problems in the interim please let me know

## 2023-12-15 ENCOUNTER — Encounter: Payer: Self-pay | Admitting: Internal Medicine

## 2023-12-15 NOTE — Progress Notes (Signed)
 Chief Complaint: Chief Complaint  Patient presents with  . Follow-up    Subjective: Samantha Hamilton is a 73 y.o. female in today for: Follow-up Patient comes in today for follow-up recently has been seeing GI for right upper quadrant pain.  She did have a slightly lowered ejection fraction of her gallbladder at 28%.  Some positive test for fibrosis in the liver with fatty liver.  Looking at possibly taking the gallbladder out to see if this helps her pain and diarrhea.  Past Medical History:  Diagnosis Date  . Allergic state   . Chicken pox   . Hypertension    Past Surgical History:  Procedure Laterality Date  . Arthroscopic partial medial meniscectomy Right 12/23/13  . HYSTERECTOMY    . KNEE ARTHROSCOPY    . TUBAL LIGATION     Family History  Problem Relation Name Age of Onset  . High blood pressure (Hypertension) Mother    . Diabetes Mother    . Heart disease Mother    . Ovarian cancer Mother    . High blood pressure (Hypertension) Father    . Pneumonia Sister    . High blood pressure (Hypertension) Brother    . High blood pressure (Hypertension) Brother    . High blood pressure (Hypertension) Sister    . Thyroid disease Sister    . High blood pressure (Hypertension) Sister    . Thyroid disease Sister    . High blood pressure (Hypertension) Sister     Current Outpatient Medications on File Prior to Visit  Medication Sig Dispense Refill  . amLODIPine-benazepril (LOTREL) 10-20 mg capsule TAKE 1 CAPSULE ONCE DAILY 90 capsule 3  . atenoloL (TENORMIN) 100 MG tablet TAKE 1 TABLET ONCE DAILY 90 tablet 3  . calcium carbonate-vitamin D3 (OS-CAL 500+D) 500 mg(1,250mg ) -200 unit tablet Take by mouth.    . estradioL (ESTRACE) 2 MG tablet Take 1 tablet (2 mg total) by mouth once daily 90 tablet 3  . ferrous sulfate 325 (65 FE) MG tablet Take 325 mg by mouth once a week.    SABRA KRILL OIL ORAL Take 1,000 mg by mouth once daily    . loratadine (CLARITIN) 10 mg tablet Take 10 mg by  mouth once daily    . spironolactone (ALDACTONE) 25 MG tablet Take 1 tablet (25 mg total) by mouth 2 (two) times daily (Patient taking differently: Take 25 mg by mouth once daily) 180 tablet 3  . SYNTHROID 88 mcg tablet Take 1 tablet (88 mcg total) by mouth once daily ON AN EMPTY STOMACH WITH A GLASS OF WATER  AT LEAST 30-60 MINUTES BEFORE BREAKFAST 90 tablet 3  . triamcinolone acetonide (ORALONE) 0.1 % paste     . zinc 50 mg Tab Take by mouth Takes twice a week     No current facility-administered medications on file prior to visit.    Review of Systems - 10 systems negative except as in HPI Objective: Vitals:   12/15/23 0818  BP: 120/64  Pulse: 62   Body mass index is 30.23 kg/m.  Encouraged healthy diet, regular exercise, regular health maintenance Assessment & Plan: Diagnoses and all orders for this visit:  Abdominal pain, RUQ (right upper quadrant) Continue to follow with GI Stage 3a chronic kidney disease (CMS-HCC) Last kidney function within normal limits, continue adequate fluids Diarrhea, unspecified type No significant findings for etiology, could be due to gallbladder issues, continue to follow with GI Nausea As above Essential hypertension -     CBC  w/auto Differential (3 Part); Future -     Comprehensive Metabolic Panel (CMP); Future -     Urinalysis w/Microscopic; Future -     Lipid Panel w/calc LDL; Future Good control, continue current medication, continue to monitor Hypothyroidism, unspecified type -     Thyroid Stimulating-Hormone (TSH); Future -     Thyroxine (T4), Free; Future Recent lab work within normal limits, continue levothyroxine Other orders -     Follow up in Primary Care -     Follow up in Primary Care; Future      Follow up in 6 months (on 06/13/2024). Follow-up     Normal Orders This Visit   Follow up in Primary Care [[id number] Custom]    Questions:   Does this order need to be coordinated with another visit or should it be hidden from  the patient portal? If yes to either, the patient will need to stop by the front desk or call to schedule.: No   Who is this follow-up with?: Me   Can this appointment be overbooked by a scheduler?: No   What type of follow up is needed?: Office Visit   What's the reason for follow up?: Chronic Medical Problems   Allow telemedicine?: In Person Only    Future Labs/Procedures Expected by Expires   Follow up in Primary Care [[id number] Custom]  06/13/2024 12/14/2024   Questions:     Does this order need to be coordinated with another visit or should it be hidden from the patient portal? If yes to either, the patient will need to stop by the front desk or call to schedule.: No   Who is this follow-up with?: PCP   What type of follow up is needed?: Physical   What's the reason for follow up?:

## 2023-12-21 ENCOUNTER — Other Ambulatory Visit: Payer: Self-pay | Admitting: *Deleted

## 2023-12-21 DIAGNOSIS — K8050 Calculus of bile duct without cholangitis or cholecystitis without obstruction: Secondary | ICD-10-CM

## 2023-12-21 NOTE — Telephone Encounter (Signed)
 Referral sent

## 2023-12-21 NOTE — Telephone Encounter (Signed)
 Pt says she is ok to see Dr. Mavis

## 2024-01-27 ENCOUNTER — Emergency Department (HOSPITAL_COMMUNITY)

## 2024-01-27 ENCOUNTER — Encounter (HOSPITAL_COMMUNITY): Payer: Self-pay | Admitting: *Deleted

## 2024-01-27 ENCOUNTER — Emergency Department (HOSPITAL_COMMUNITY)
Admission: EM | Admit: 2024-01-27 | Discharge: 2024-01-27 | Disposition: A | Discharge status: Home / Self Care | Attending: Emergency Medicine | Admitting: Emergency Medicine

## 2024-01-27 ENCOUNTER — Other Ambulatory Visit: Payer: Self-pay

## 2024-01-27 DIAGNOSIS — R1084 Generalized abdominal pain: Secondary | ICD-10-CM | POA: Diagnosis present

## 2024-01-27 DIAGNOSIS — K760 Fatty (change of) liver, not elsewhere classified: Secondary | ICD-10-CM | POA: Insufficient documentation

## 2024-01-27 DIAGNOSIS — I700 Atherosclerosis of aorta: Secondary | ICD-10-CM | POA: Insufficient documentation

## 2024-01-27 LAB — COMPREHENSIVE METABOLIC PANEL WITH GFR
ALT: 9 U/L (ref 0–44)
AST: 13 U/L — ABNORMAL LOW (ref 15–41)
Albumin: 4 g/dL (ref 3.5–5.0)
Alkaline Phosphatase: 42 U/L (ref 38–126)
Anion gap: 12 (ref 5–15)
BUN: 16 mg/dL (ref 8–23)
CO2: 23 mmol/L (ref 22–32)
Calcium: 9.2 mg/dL (ref 8.9–10.3)
Chloride: 103 mmol/L (ref 98–111)
Creatinine, Ser: 0.85 mg/dL (ref 0.44–1.00)
GFR, Estimated: >60 mL/min
Glucose, Bld: 102 mg/dL — ABNORMAL HIGH (ref 70–99)
Potassium: 4.2 mmol/L (ref 3.5–5.1)
Sodium: 138 mmol/L (ref 135–145)
Total Bilirubin: 1 mg/dL (ref 0.0–1.2)
Total Protein: 7.3 g/dL (ref 6.5–8.1)

## 2024-01-27 LAB — URINALYSIS, ROUTINE W REFLEX MICROSCOPIC
Bilirubin Urine: NEGATIVE
Glucose, UA: NEGATIVE mg/dL
Hgb urine dipstick: NEGATIVE
Ketones, ur: NEGATIVE mg/dL
Leukocytes,Ua: NEGATIVE
Nitrite: NEGATIVE
Protein, ur: NEGATIVE mg/dL
Specific Gravity, Urine: >1.046 — ABNORMAL HIGH (ref 1.005–1.030)
pH: 7 (ref 5.0–8.0)

## 2024-01-27 LAB — CBC WITH DIFFERENTIAL/PLATELET
Abs Immature Granulocytes: 0.03 K/uL (ref 0.00–0.07)
Basophils Absolute: 0 K/uL (ref 0.0–0.1)
Basophils Relative: 0 %
Eosinophils Absolute: 0.1 K/uL (ref 0.0–0.5)
Eosinophils Relative: 1 %
HCT: 39.5 % (ref 36.0–46.0)
Hemoglobin: 13.3 g/dL (ref 12.0–15.0)
Immature Granulocytes: 0 %
Lymphocytes Relative: 19 %
Lymphs Abs: 2.3 K/uL (ref 0.7–4.0)
MCH: 31.4 pg (ref 26.0–34.0)
MCHC: 33.7 g/dL (ref 30.0–36.0)
MCV: 93.4 fL (ref 80.0–100.0)
Monocytes Absolute: 1.2 K/uL — ABNORMAL HIGH (ref 0.1–1.0)
Monocytes Relative: 10 %
Neutro Abs: 8.3 K/uL — ABNORMAL HIGH (ref 1.7–7.7)
Neutrophils Relative %: 70 %
Platelets: 318 K/uL (ref 150–400)
RBC: 4.23 MIL/uL (ref 3.87–5.11)
RDW: 12.2 % (ref 11.5–15.5)
WBC: 11.8 K/uL — ABNORMAL HIGH (ref 4.0–10.5)
nRBC: 0 % (ref 0.0–0.2)

## 2024-01-27 LAB — LIPASE, BLOOD: Lipase: 16 U/L (ref 11–51)

## 2024-01-27 MED ORDER — ONDANSETRON HCL 4 MG/2ML IJ SOLN
4.0000 mg | Freq: Once | INTRAMUSCULAR | Status: AC
  Administered 2024-01-27: 4 mg via INTRAVENOUS
  Filled 2024-01-27: qty 2

## 2024-01-27 MED ORDER — FENTANYL CITRATE (PF) 100 MCG/2ML IJ SOLN
25.0000 ug | Freq: Once | INTRAMUSCULAR | Status: AC
  Administered 2024-01-27: 25 ug via INTRAVENOUS
  Filled 2024-01-27: qty 2

## 2024-01-27 MED ORDER — IOHEXOL 300 MG/ML  SOLN
100.0000 mL | Freq: Once | INTRAMUSCULAR | Status: AC | PRN
  Administered 2024-01-27: 100 mL via INTRAVENOUS

## 2024-01-27 MED ORDER — SODIUM CHLORIDE 0.9 % IV SOLN: INTRAVENOUS | Status: DC

## 2024-01-27 NOTE — ED Provider Notes (Signed)
 " [place] [facility] AT [facility] Provider Note   CSN: [id number] Arrival date & time: 01/27/24  9192     Patient presents with: Abdominal Pain   Samantha Hamilton is a 73 y.o. female.   HPI Patient with known fatty liver disease, in the process of evaluation for cholecystectomy presents with abdominal pain, nausea, anorexia. Onset 5 days ago, initially with associated vomiting, pain focally in the right upper quadrant, now with diffuse abdominal pain, anorexia, nausea, but no additional vomiting.    Prior to Admission medications  Medication Sig Start Date End Date Taking? Authorizing Provider  amLODipine -benazepril  (LOTREL) 10-20 MG per capsule Take 1 capsule by mouth daily. 05/16/13   [provider]  atenolol  (TENORMIN ) 100 MG tablet Take 100 mg by mouth daily.    [provider]  calcium-vitamin D (OSCAL WITH D) 500-200 MG-UNIT per tablet Take 1 tablet by mouth 2 (two) times daily.    [provider]  estradiol  (ESTRACE ) 2 MG tablet Take 2 mg by mouth daily. 05/02/23   [provider]  ferrous sulfate 325 (65 FE) MG tablet Take by mouth.    [provider]  Anselm Oil 1000 MG CAPS Take by mouth.    [provider]  loperamide (IMODIUM A-D) 2 MG tablet Take 2 mg by mouth at bedtime.    [provider]  loratadine (CLARITIN) 10 MG tablet Take by mouth.    [provider]  spironolactone  (ALDACTONE ) 25 MG tablet Take 25 mg by mouth 2 (two) times daily.    [provider]  SYNTHROID  88 MCG tablet Take by mouth. 12/30/22   [provider]  triamcinolone (KENALOG) 0.1 % paste  09/14/15   [provider]  zinc gluconate 50 MG tablet Take 50 mg by mouth daily.    [provider]    Allergies: Codeine, Shellfish allergy, Metoprolol tartrate, Other, and Transderm-scop [scopolamine]    Review of Systems  Updated Vital Signs BP (!) 130/47   Pulse 60   Temp  98.6 F (37 C)   Resp 18   Ht 1.575 m (5' 2)   Wt 68 kg   SpO2 92%   BMI 27.44 kg/m   Physical Exam Vitals and nursing note reviewed.  Constitutional:      General: She is not in acute distress.    Appearance: She is well-developed.  HENT:     Head: Normocephalic and atraumatic.  Eyes:     Conjunctiva/sclera: Conjunctivae normal.  Cardiovascular:     Rate and Rhythm: Normal rate and regular rhythm.  Pulmonary:     Effort: Pulmonary effort is normal. No respiratory distress.     Breath sounds: No stridor.  Abdominal:     General: There is no distension.     Tenderness: There is generalized abdominal tenderness. There is no guarding or rebound.  Skin:    General: Skin is warm and dry.  Neurological:     Mental Status: She is alert and oriented to person, place, and time.     Cranial Nerves: No cranial nerve deficit.  Psychiatric:        Mood and Affect: Mood normal.     (all labs ordered are listed, but only abnormal results are displayed) Labs Reviewed  COMPREHENSIVE METABOLIC PANEL WITH GFR - Abnormal; Notable for the following components:      Result Value   Glucose, Bld 102 (*)    AST 13 (*)    All  other components within normal limits  CBC WITH DIFFERENTIAL/PLATELET - Abnormal; Notable for the following components:   WBC 11.8 (*)    Neutro Abs 8.3 (*)    Monocytes Absolute 1.2 (*)    All other components within normal limits  URINALYSIS, ROUTINE W REFLEX MICROSCOPIC - Abnormal; Notable for the following components:   Specific Gravity, Urine >1.046 (*)    All other components within normal limits  LIPASE, BLOOD    EKG: None  Radiology: CT ABDOMEN PELVIS W CONTRAST Result Date: 01/27/2024 CLINICAL DATA:  Abdominal pain, acute, nonlocalized EXAM: CT ABDOMEN AND PELVIS WITH CONTRAST TECHNIQUE: Multidetector CT imaging of the abdomen and pelvis was performed using the standard protocol following bolus administration of intravenous contrast. RADIATION DOSE  REDUCTION: This exam was performed according to the departmental dose-optimization program which includes automated exposure control, adjustment of the mA and/or kV according to patient size and/or use of iterative reconstruction technique. CONTRAST:  OMNIPAQUE  IOHEXOL  300 MG/ML  SOLN COMPARISON:  September 16, 2023, April 01, 2023 FINDINGS: Lower chest: No acute abnormality. Hepatobiliary: There is circumferential wall thickening of the gallbladder with adjacent pericholecystic fat stranding. There is possible mucosal enhancement of the common bile duct. Common bile duct is prominent and measures 9 mm at the level of the pancreas, previously 7 mm. Mild central biliary ductal dilation. Pancreas: Unremarkable. No pancreatic ductal dilatation or surrounding inflammatory changes. Spleen: Unremarkable. Adrenals/Urinary Tract: Adrenal glands are unremarkable. Kidneys enhance symmetrically. No hydronephrosis. No obstructing nephrolithiasis. Low-density mass within the RIGHT renal pelvis measures slightly above simple fluid density and spans approximately 12 mm, unchanged compared to prior. Punctate nonobstructing RIGHT-sided nephrolithiasis. Bladder is decompressed and otherwise unremarkable. Stomach/Bowel: No evidence of bowel obstruction. Diverticulosis. Appendix is normal. Stomach is decompressed. Vascular/Lymphatic: Atherosclerotic calcifications of the nonaneurysmal abdominal aorta. No suspicious lymphadenopathy is identified. Reproductive: Status post hysterectomy. RIGHT adnexal cyst measures up to 3.6 cm, previously 4.1 cm. LEFT adnexal cyst measures approximately 15 mm, similar compared to prior. Other: Trace free fluid.  No free air. Musculoskeletal: Degenerative changes most pronounced at L2-3 with intervertebral disc space height loss and reactive endplate sclerosis. Facet arthropathy of the lumbar spine. IMPRESSION: 1. There is circumferential wall thickening of the gallbladder with adjacent  pericholecystic fat stranding. Findings are consistent with acute cholecystitis. 2. There is possible mucosal enhancement of the common bile duct. This could be reactive in etiology. Recommend correlation with LFTs to exclude ascending cholangitis. 3. Low-density mass within the RIGHT renal pelvis measures slightly above simple fluid density and spans approximately 12 mm, unchanged compared to prior. This likely reflects a mildly complicated cyst. Consider further evaluation with nonemergent renal ultrasound. 4. Bilateral adnexal cysts, similar compared to prior. Recommend follow-up US  in 6-12 months. Note: This recommendation does not apply to premenarchal patients and to those with increased risk (genetic, family history, elevated tumor markers or other high-risk factors) of ovarian cancer. Reference: JACR 2020 Feb; 17(2):248-254 Aortic Atherosclerosis (ICD10-I70.0). Electronically Signed   By: Corean Salter M.D.   On: 01/27/2024 10:47     Procedures   Medications Ordered in the ED  0.9 %  sodium chloride  infusion ( Intravenous New Bag/Given 01/27/24 0846)  ondansetron  (ZOFRAN ) injection 4 mg (4 mg Intravenous Given 01/27/24 0846)  fentaNYL  (SUBLIMAZE ) injection 25 mcg (25 mcg Intravenous Given 01/27/24 0846)  iohexol  (OMNIPAQUE ) 300 MG/ML solution 100 mL (100 mLs Intravenous Contrast Given 01/27/24 0950)  Medical Decision Making Elderly female with known hepatic dysfunction presents with diffuse abdominal pain, nausea, anorexia for days. Broad differential including worsening hepatobiliary dysfunction, diverticulitis, colitis Initial vitals reassuring, cardiac 60 sinus normal pulse ox 100% room air normal  Amount and/or Complexity of Data Reviewed External Data Reviewed: notes.    Details: Ongoing evaluation with gastroenterology Labs: ordered. Decision-making details documented in ED Course. Radiology: ordered and independent interpretation  performed. Decision-making details documented in ED Course.  Risk Prescription drug management. Decision regarding hospitalization. Diagnosis or treatment significantly limited by social determinants of health.  CT results reviewed, discussed with our general surgeon.  Subsequently discussed with patient. She last ate this morning, just prior to arriving to ED 1:43 PM Patient and I have had a lengthy conversation about today's evaluation, her ongoing abdominal pain, and known issues with her gallbladder. Patient is hemodynamically unremarkable, we discussed admission for anticipated cholecystectomy possibly in 2 days versus discharge with close outpatient follow-up and return precautions.  Patient is no evidence for bacteremia, sepsis, no evidence of perforation, nor peritonitis, states that she would like to go home, possibly return tomorrow if symptoms are persistent or worsening. Our surgeon with whom I discussed the case is aware patient may return in the coming days should her symptoms return. Otherwise, patient will follow-up as an outpatient.     Final diagnoses:  Generalized abdominal pain    ED Discharge Orders     None          Garrick Charleston, MD 01/27/24 1343  "

## 2024-01-27 NOTE — ED Triage Notes (Signed)
 Pt c/o right side abdominal pain x 5 days; pt has had nausea and a couple episodes of vomiting  Pt states the pain is worse with movement and states he can not get in a comfortable position

## 2024-01-27 NOTE — Discharge Instructions (Signed)
 It is very important that you monitor your condition carefully and do not hesitate to return here for any changes in your condition.  Otherwise, follow-up with Dr. Mavis.

## 2024-01-28 ENCOUNTER — Encounter (HOSPITAL_COMMUNITY): Payer: Self-pay | Admitting: *Deleted

## 2024-01-28 ENCOUNTER — Observation Stay (HOSPITAL_COMMUNITY)
Admission: EM | Admit: 2024-01-28 | Discharge: 2024-01-30 | Disposition: A | Discharge status: Home / Self Care | Attending: General Surgery | Admitting: General Surgery

## 2024-01-28 ENCOUNTER — Other Ambulatory Visit: Payer: Self-pay

## 2024-01-28 DIAGNOSIS — K8000 Calculus of gallbladder with acute cholecystitis without obstruction: Secondary | ICD-10-CM

## 2024-01-28 DIAGNOSIS — K8010 Calculus of gallbladder with chronic cholecystitis without obstruction: Principal | ICD-10-CM | POA: Diagnosis present

## 2024-01-28 DIAGNOSIS — K8012 Calculus of gallbladder with acute and chronic cholecystitis without obstruction: Secondary | ICD-10-CM | POA: Diagnosis not present

## 2024-01-28 DIAGNOSIS — I10 Essential (primary) hypertension: Secondary | ICD-10-CM | POA: Insufficient documentation

## 2024-01-28 DIAGNOSIS — R1011 Right upper quadrant pain: Secondary | ICD-10-CM | POA: Diagnosis present

## 2024-01-28 DIAGNOSIS — K7581 Nonalcoholic steatohepatitis (NASH): Secondary | ICD-10-CM | POA: Insufficient documentation

## 2024-01-28 DIAGNOSIS — E039 Hypothyroidism, unspecified: Secondary | ICD-10-CM | POA: Diagnosis not present

## 2024-01-28 DIAGNOSIS — K7400 Hepatic fibrosis, unspecified: Secondary | ICD-10-CM | POA: Insufficient documentation

## 2024-01-28 MED ORDER — SODIUM CHLORIDE 0.9 % IV SOLN: INTRAVENOUS | Status: AC

## 2024-01-28 MED ORDER — ESTRADIOL 2 MG PO TABS
2.0000 mg | ORAL_TABLET | Freq: Every day | ORAL | Status: DC
  Filled 2024-01-28 (×3): qty 1

## 2024-01-28 MED ORDER — AMLODIPINE BESYLATE 5 MG PO TABS
10.0000 mg | ORAL_TABLET | Freq: Every day | ORAL | Status: DC
  Filled 2024-01-28: qty 2

## 2024-01-28 MED ORDER — INDOCYANINE GREEN 25 MG IJ SOLR
2.5000 mg | Freq: Once | INTRAMUSCULAR | Status: AC
  Filled 2024-01-28: qty 10

## 2024-01-28 MED ORDER — MORPHINE SULFATE (PF) 2 MG/ML IV SOLN
2.0000 mg | INTRAVENOUS | Status: DC | PRN
  Administered 2024-01-29 (×2): 2 mg via INTRAVENOUS
  Filled 2024-01-28 (×2): qty 1

## 2024-01-28 MED ORDER — OXYCODONE HCL 5 MG PO TABS
5.0000 mg | ORAL_TABLET | ORAL | Status: DC | PRN
  Administered 2024-01-29 – 2024-01-30 (×2): 5 mg via ORAL
  Filled 2024-01-28 (×2): qty 1

## 2024-01-28 MED ORDER — SPIRONOLACTONE 25 MG PO TABS
25.0000 mg | ORAL_TABLET | Freq: Two times a day (BID) | ORAL | Status: DC
  Filled 2024-01-28 (×2): qty 1

## 2024-01-28 MED ORDER — ACETAMINOPHEN 650 MG RE SUPP: 650.0000 mg | Freq: Four times a day (QID) | RECTAL | Status: DC | PRN

## 2024-01-28 MED ORDER — CHLORHEXIDINE GLUCONATE CLOTH 2 % EX PADS
6.0000 | MEDICATED_PAD | Freq: Once | CUTANEOUS | Status: AC
  Administered 2024-01-28: 6 via TOPICAL

## 2024-01-28 MED ORDER — ACETAMINOPHEN 325 MG PO TABS: 650.0000 mg | ORAL_TABLET | Freq: Four times a day (QID) | ORAL | Status: DC | PRN

## 2024-01-28 MED ORDER — LEVOTHYROXINE SODIUM 88 MCG PO TABS
88.0000 ug | ORAL_TABLET | Freq: Every day | ORAL | Status: DC
  Administered 2024-01-29 – 2024-01-30 (×2): 88 ug via ORAL
  Filled 2024-01-28 (×2): qty 1

## 2024-01-28 MED ORDER — ONDANSETRON 4 MG PO TBDP: 4.0000 mg | ORAL_TABLET | Freq: Four times a day (QID) | ORAL | Status: DC | PRN

## 2024-01-28 MED ORDER — SODIUM CHLORIDE 0.9 % IV SOLN
2.0000 g | INTRAVENOUS | Status: DC
  Administered 2024-01-28 – 2024-01-29 (×2): 2 g via INTRAVENOUS
  Filled 2024-01-28 (×2): qty 20

## 2024-01-28 MED ORDER — ATENOLOL 25 MG PO TABS: 100.0000 mg | ORAL_TABLET | Freq: Every day | ORAL | Status: DC

## 2024-01-28 MED ORDER — ENOXAPARIN SODIUM 40 MG/0.4ML IJ SOSY
40.0000 mg | PREFILLED_SYRINGE | INTRAMUSCULAR | Status: DC
  Administered 2024-01-28: 40 mg via SUBCUTANEOUS
  Filled 2024-01-28: qty 0.4

## 2024-01-28 MED ORDER — ONDANSETRON HCL 4 MG/2ML IJ SOLN
4.0000 mg | Freq: Four times a day (QID) | INTRAMUSCULAR | Status: DC | PRN
  Administered 2024-01-29 – 2024-01-30 (×2): 4 mg via INTRAVENOUS
  Filled 2024-01-28 (×2): qty 2

## 2024-01-28 MED ORDER — BENAZEPRIL HCL 20 MG PO TABS
20.0000 mg | ORAL_TABLET | Freq: Every day | ORAL | Status: DC
  Filled 2024-01-28: qty 1

## 2024-01-28 NOTE — H&P (Signed)
 Samantha Hamilton is an 73 y.o. female.   Chief Complaint: Right upper quadrant abdominal pain, cholelithiasis HPI: Patient is a 73 year old white female with a history of biliary dyskinesia, early liver fibrosis, and cholelithiasis who presented to the emergency room several days ago with worsening right upper quadrant abdominal pain that had been lasting for several days.  She was discharged from the emergency room as she wanted to celebrate Christmas at home.  She presents back this evening with recurrent right upper quadrant abdominal pain.  She states she has had this on and off for many months.  She is followed by Dr. Shaaron of GI.  She denies any fever, chills, or jaundice.  She was supposed to see me in my office for an elective cholecystectomy.  Past Medical History:  Diagnosis Date   Arthritis    Fatty liver    Heart rate fast    Hyperlipidemia    Hypertension    Hypothyroidism    PONV (postoperative nausea and vomiting)     Past Surgical History:  Procedure Laterality Date   ABDOMINAL HYSTERECTOMY     BREAST BIOPSY Right 06/08/2013   neg- APOCRINE CYSTS   COLONOSCOPY N/A 04/14/2014   Procedure: COLONOSCOPY;  Surgeon: Lamar Hamilton Shaaron, MD;  Location: [facility];  Service: Endoscopy;  Laterality: N/A;  11:30 AM   COLONOSCOPY N/A 07/13/2023   Procedure: COLONOSCOPY;  Surgeon: Samantha Lamar CHRISTELLA, MD;  Location: [facility];  Service: Endoscopy;  Laterality: N/A;  9:00 AM, ASA 2   KNEE ARTHROSCOPY Right    KNEE ARTHROSCOPY Left 06/27/2015   Procedure: ARTHROSCOPY KNEE, Partial Medial Menisectomy, Chondral Debridement ;  Surgeon: Helayne Glenn, MD;  Location: [facility] ORS;  Service: Orthopedics;  Laterality: Left;   TUBAL LIGATION      Family History  Problem Relation Age of Onset   Breast cancer Neg Hx    Celiac disease Neg Hx    Inflammatory bowel disease Neg Hx    Colon cancer Neg Hx    Social History:  reports that she has never smoked. She has never used smokeless tobacco.  She reports that she does not drink alcohol and does not use drugs.  Allergies: Allergies[1]  (Not in a hospital admission)   Results for orders placed or performed during the hospital encounter of 01/27/24 (from the past 48 hours)  Comprehensive metabolic panel     Status: Abnormal   Collection Time: 01/27/24  8:52 AM  Result Value Ref Range   Sodium 138 135 - 145 mmol/L   Potassium 4.2 3.5 - 5.1 mmol/L   Chloride 103 98 - 111 mmol/L   CO2 23 22 - 32 mmol/L   Glucose, Bld 102 (H) 70 - 99 mg/dL    Comment: Glucose reference range applies only to samples taken after fasting for at least 8 hours.   BUN 16 8 - 23 mg/dL   Creatinine, Ser 9.14 0.44 - 1.00 mg/dL   Calcium 9.2 8.9 - 89.6 mg/dL   Total Protein 7.3 6.5 - 8.1 g/dL   Albumin 4.0 3.5 - 5.0 g/dL   AST 13 (L) 15 - 41 U/L   ALT 9 0 - 44 U/L   Alkaline Phosphatase 42 38 - 126 U/L   Total Bilirubin 1.0 0.0 - 1.2 mg/dL   GFR, Estimated >39 >39 mL/min    Comment: (NOTE) Calculated using the CKD-EPI Creatinine Equation (2021)    Anion gap 12 5 - 15    Comment: Performed at [facility], [address], [place], [place] [address]  Lipase, blood     Status: None   Collection Time: 01/27/24  8:52 AM  Result Value Ref Range   Lipase 16 11 - 51 U/L    Comment: Performed at [facility], [address] [place], [place] [address]  CBC with Diff     Status: Abnormal   Collection Time: 01/27/24  8:52 AM  Result Value Ref Range   WBC 11.8 (H) 4.0 - 10.5 K/uL   RBC 4.23 3.87 - 5.11 MIL/uL   Hemoglobin 13.3 12.0 - 15.0 g/dL   HCT 60.4 63.9 - 53.9 %   MCV 93.4 80.0 - 100.0 fL   MCH 31.4 26.0 - 34.0 pg   MCHC 33.7 30.0 - 36.0 g/dL   RDW 87.7 88.4 - 84.4 %   Platelets 318 150 - 400 K/uL   nRBC 0.0 0.0 - 0.2 %   Neutrophils Relative % 70 %   Neutro Abs 8.3 (H) 1.7 - 7.7 K/uL   Lymphocytes Relative 19 %   Lymphs Abs 2.3 0.7 - 4.0 K/uL   Monocytes Relative 10 %   Monocytes Absolute 1.2 (H) 0.1 - 1.0 K/uL   Eosinophils Relative 1 %    Eosinophils Absolute 0.1 0.0 - 0.5 K/uL   Basophils Relative 0 %   Basophils Absolute 0.0 0.0 - 0.1 K/uL   Immature Granulocytes 0 %   Abs Immature Granulocytes 0.03 0.00 - 0.07 K/uL    Comment: Performed at [facility], [address], [place], [place] [address]  Urinalysis, Routine w reflex microscopic -Urine, Clean Catch     Status: Abnormal   Collection Time: 01/27/24 11:48 AM  Result Value Ref Range   Color, Urine YELLOW YELLOW   APPearance CLEAR CLEAR   Specific Gravity, Urine >1.046 (H) 1.005 - 1.030   pH 7.0 5.0 - 8.0   Glucose, UA NEGATIVE NEGATIVE mg/dL   Hgb urine dipstick NEGATIVE NEGATIVE   Bilirubin Urine NEGATIVE NEGATIVE   Ketones, ur NEGATIVE NEGATIVE mg/dL   Protein, ur NEGATIVE NEGATIVE mg/dL   Nitrite NEGATIVE NEGATIVE   Leukocytes,Ua NEGATIVE NEGATIVE    Comment: Performed at [facility], [address] [place], [place] [address]   CT ABDOMEN PELVIS W CONTRAST Result Date: 01/27/2024 CLINICAL DATA:  Abdominal pain, acute, nonlocalized EXAM: CT ABDOMEN AND PELVIS WITH CONTRAST TECHNIQUE: Multidetector CT imaging of the abdomen and pelvis was performed using the standard protocol following bolus administration of intravenous contrast. RADIATION DOSE REDUCTION: This exam was performed according to the departmental dose-optimization program which includes automated exposure control, adjustment of the mA and/or kV according to patient size and/or use of iterative reconstruction technique. CONTRAST:  OMNIPAQUE  IOHEXOL  300 MG/ML  SOLN COMPARISON:  September 16, 2023, April 01, 2023 FINDINGS: Lower chest: No acute abnormality. Hepatobiliary: There is circumferential wall thickening of the gallbladder with adjacent pericholecystic fat stranding. There is possible mucosal enhancement of the common bile duct. Common bile duct is prominent and measures 9 mm at the level of the pancreas, previously 7 mm. Mild central biliary ductal dilation. Pancreas: Unremarkable. No  pancreatic ductal dilatation or surrounding inflammatory changes. Spleen: Unremarkable. Adrenals/Urinary Tract: Adrenal glands are unremarkable. Kidneys enhance symmetrically. No hydronephrosis. No obstructing nephrolithiasis. Low-density mass within the RIGHT renal pelvis measures slightly above simple fluid density and spans approximately 12 mm, unchanged compared to prior. Punctate nonobstructing RIGHT-sided nephrolithiasis. Bladder is decompressed and otherwise unremarkable. Stomach/Bowel: No evidence of bowel obstruction. Diverticulosis. Appendix is normal. Stomach  is decompressed. Vascular/Lymphatic: Atherosclerotic calcifications of the nonaneurysmal abdominal aorta. No suspicious lymphadenopathy is identified. Reproductive: Status post hysterectomy. RIGHT adnexal cyst measures up to 3.6 cm, previously 4.1 cm. LEFT adnexal cyst measures approximately 15 mm, similar compared to prior. Other: Trace free fluid.  No free air. Musculoskeletal: Degenerative changes most pronounced at L2-3 with intervertebral disc space height loss and reactive endplate sclerosis. Facet arthropathy of the lumbar spine. IMPRESSION: 1. There is circumferential wall thickening of the gallbladder with adjacent pericholecystic fat stranding. Findings are consistent with acute cholecystitis. 2. There is possible mucosal enhancement of the common bile duct. This could be reactive in etiology. Recommend correlation with LFTs to exclude ascending cholangitis. 3. Low-density mass within the RIGHT renal pelvis measures slightly above simple fluid density and spans approximately 12 mm, unchanged compared to prior. This likely reflects a mildly complicated cyst. Consider further evaluation with nonemergent renal ultrasound. 4. Bilateral adnexal cysts, similar compared to prior. Recommend follow-up US  in 6-12 months. Note: This recommendation does not apply to premenarchal patients and to those with increased risk (genetic, family history,  elevated tumor markers or other high-risk factors) of ovarian cancer. Reference: JACR 2020 Feb; 17(2):248-254 Aortic Atherosclerosis (ICD10-I70.0). Electronically Signed   By: Corean Salter M.D.   On: 01/27/2024 10:47    Review of Systems  Constitutional:  Positive for fatigue.  HENT: Negative.    Eyes: Negative.   Respiratory: Negative.    Cardiovascular: Negative.   Gastrointestinal:  Positive for abdominal pain and nausea.  Endocrine: Negative.   Genitourinary: Negative.   Musculoskeletal: Negative.   Skin: Negative.   Allergic/Immunologic: Negative.   Neurological: Negative.   Hematological: Negative.   Psychiatric/Behavioral: Negative.      Blood pressure (!) 135/52, pulse 65, temperature 98.6 F (37 C), temperature source Oral, resp. rate 18, height 5' 2 (1.575 m), weight 68 kg, SpO2 95%. Physical Exam Vitals reviewed.  Constitutional:      Appearance: She is well-developed. She is not ill-appearing.  HENT:     Head: Normocephalic and atraumatic.  Cardiovascular:     Rate and Rhythm: Normal rate and regular rhythm.     Heart sounds: Normal heart sounds. No murmur heard.    No friction rub. No gallop.  Pulmonary:     Effort: Pulmonary effort is normal. No respiratory distress.     Breath sounds: Normal breath sounds. No stridor. No wheezing, rhonchi or rales.  Abdominal:     General: There is no distension.     Palpations: Abdomen is soft.     Tenderness: There is abdominal tenderness in the right upper quadrant. Positive signs include Murphy's sign.     Hernia: No hernia is present.  Skin:    General: Skin is warm and dry.  Neurological:     Mental Status: She is alert and oriented to person, place, and time.   Previous ER notes and CT scan results reviewed.  GI notes reviewed.  Assessment/Plan Impression: Biliary colic secondary to cholelithiasis, history of early liver fibrosis Plan: Patient will be brought into the hospital under observation status to  control her pain, nausea, and give her IV fluids.  She will be started on IV Rocephin .  She subsequently will undergo a robotic assisted laparoscopic cholecystectomy with liver biopsy on 01/29/2024.  The risks and benefits of the procedure including bleeding, infection, hepatobiliary injury, and the possibility of an open procedure were fully explained to the patient, who gave informed consent.  Oneil Budge, MD 01/28/2024, 9:36 PM       [  1]  Allergies Allergen Reactions   Codeine Nausea Only   Shellfish Allergy Anaphylaxis   Metoprolol Tartrate     INCREASED LFT INTOLERANCE  MEDICATION CHANGED  LRT BACK TO NORMAL   Other     Other Reaction(s): Liver Disorder  INCREASED LFT  INTOLERANCE  MEDICATION CHANGED   LRT BACK TO NORMAL   Transderm-Scop [Scopolamine] Nausea And Vomiting    X SEVERAL DAYS

## 2024-01-28 NOTE — ED Triage Notes (Signed)
 Pt seen here yesterday, pt states she was told to come back today to see Dr. Mavis for gallbladder surgery.

## 2024-01-28 NOTE — ED Provider Notes (Incomplete)
 " [place] EMERGENCY DEPARTMENT AT [facility] Provider Note   CSN: [id number] Arrival date & time: 01/28/24  1941     Patient presents with: Abdominal Pain   Samantha Hamilton is a 73 y.o. female.  {Add pertinent medical, surgical, social history, OB history to YEP:[id number]} The history is provided by the patient.  Abdominal Pain  She has history of hypertension, hyperlipidemia and was recently diagnosed with gallstones and comes in with recurrent pain.  She has been seen by her surgeon who is planning to admit her for for cholecystectomy in the morning.    Prior to Admission medications  Medication Sig Start Date End Date Taking? Authorizing Provider  amLODipine -benazepril  (LOTREL) 10-20 MG per capsule Take 1 capsule by mouth daily. 05/16/13   [provider]  atenolol  (TENORMIN ) 100 MG tablet Take 100 mg by mouth daily.    [provider]  calcium-vitamin D (OSCAL WITH D) 500-200 MG-UNIT per tablet Take 1 tablet by mouth 2 (two) times daily.    [provider]  estradiol  (ESTRACE ) 2 MG tablet Take 2 mg by mouth daily. 05/02/23   [provider]  ferrous sulfate 325 (65 FE) MG tablet Take by mouth.    [provider]  Anselm Oil 1000 MG CAPS Take by mouth.    [provider]  loperamide (IMODIUM A-D) 2 MG tablet Take 2 mg by mouth at bedtime.    [provider]  loratadine (CLARITIN) 10 MG tablet Take by mouth.    [provider]  spironolactone  (ALDACTONE ) 25 MG tablet Take 25 mg by mouth 2 (two) times daily.    [provider]  SYNTHROID  88 MCG tablet Take by mouth. 12/30/22   [provider]  triamcinolone (KENALOG) 0.1 % paste  09/14/15   [provider]  zinc gluconate 50 MG tablet Take 50 mg by mouth daily.    [provider]    Allergies: Codeine, Shellfish allergy, Metoprolol tartrate, Other, and Transderm-scop [scopolamine]    Review of Systems   Gastrointestinal:  Positive for abdominal pain.  All other systems reviewed and are negative.   Updated Vital Signs BP (!) 135/52 (BP Location: Right Arm)   Pulse 65   Temp 98.6 F (37 C) (Oral)   Resp 18   Ht 5' 2 (1.575 m)   Wt 68 kg   SpO2 95%   BMI 27.42 kg/m   Physical Exam Vitals and nursing note reviewed.   *** year old ***female, resting comfortably and in no acute distress. Vital signs are ***. Oxygen saturation is ***%, which is normal. Head is normocephalic and atraumatic. PERRLA, EOMI. Oropharynx is clear. Neck is nontender and supple without adenopathy or JVD. Back is nontender and there is no CVA tenderness. Lungs are clear without rales, wheezes, or rhonchi. Chest is nontender. Heart has regular rate and rhythm without murmur. Abdomen is soft, flat, nontender without masses or hepatosplenomegaly and peristalsis is normoactive. Extremities have no cyanosis or edema, full range of motion is present. Skin is warm and dry without rash. Neurologic: Mental status is normal, cranial nerves are intact, there are no motor or sensory deficits.  (all labs ordered are listed, but only abnormal results are displayed) Labs Reviewed - No data to display  EKG: None  Radiology: CT ABDOMEN PELVIS W CONTRAST Result Date: 01/27/2024 CLINICAL DATA:  Abdominal pain, acute, nonlocalized EXAM: CT ABDOMEN AND PELVIS WITH CONTRAST TECHNIQUE: Multidetector CT imaging of the abdomen and pelvis was performed  using the standard protocol following bolus administration of intravenous contrast. RADIATION DOSE REDUCTION: This exam was performed according to the departmental dose-optimization program which includes automated exposure control, adjustment of the mA and/or kV according to patient size and/or use of iterative reconstruction technique. CONTRAST:  OMNIPAQUE  IOHEXOL  300 MG/ML  SOLN COMPARISON:  September 16, 2023, April 01, 2023 FINDINGS: Lower chest: No acute abnormality.  Hepatobiliary: There is circumferential wall thickening of the gallbladder with adjacent pericholecystic fat stranding. There is possible mucosal enhancement of the common bile duct. Common bile duct is prominent and measures 9 mm at the level of the pancreas, previously 7 mm. Mild central biliary ductal dilation. Pancreas: Unremarkable. No pancreatic ductal dilatation or surrounding inflammatory changes. Spleen: Unremarkable. Adrenals/Urinary Tract: Adrenal glands are unremarkable. Kidneys enhance symmetrically. No hydronephrosis. No obstructing nephrolithiasis. Low-density mass within the RIGHT renal pelvis measures slightly above simple fluid density and spans approximately 12 mm, unchanged compared to prior. Punctate nonobstructing RIGHT-sided nephrolithiasis. Bladder is decompressed and otherwise unremarkable. Stomach/Bowel: No evidence of bowel obstruction. Diverticulosis. Appendix is normal. Stomach is decompressed. Vascular/Lymphatic: Atherosclerotic calcifications of the nonaneurysmal abdominal aorta. No suspicious lymphadenopathy is identified. Reproductive: Status post hysterectomy. RIGHT adnexal cyst measures up to 3.6 cm, previously 4.1 cm. LEFT adnexal cyst measures approximately 15 mm, similar compared to prior. Other: Trace free fluid.  No free air. Musculoskeletal: Degenerative changes most pronounced at L2-3 with intervertebral disc space height loss and reactive endplate sclerosis. Facet arthropathy of the lumbar spine. IMPRESSION: 1. There is circumferential wall thickening of the gallbladder with adjacent pericholecystic fat stranding. Findings are consistent with acute cholecystitis. 2. There is possible mucosal enhancement of the common bile duct. This could be reactive in etiology. Recommend correlation with LFTs to exclude ascending cholangitis. 3. Low-density mass within the RIGHT renal pelvis measures slightly above simple fluid density and spans approximately 12 mm, unchanged compared to  prior. This likely reflects a mildly complicated cyst. Consider further evaluation with nonemergent renal ultrasound. 4. Bilateral adnexal cysts, similar compared to prior. Recommend follow-up US  in 6-12 months. Note: This recommendation does not apply to premenarchal patients and to those with increased risk (genetic, family history, elevated tumor markers or other high-risk factors) of ovarian cancer. Reference: JACR 2020 Feb; 17(2):248-254 Aortic Atherosclerosis (ICD10-I70.0). Electronically Signed   By: Corean Salter M.D.   On: 01/27/2024 10:47    {Document cardiac monitor, telemetry assessment procedure when appropriate:32947} Procedures   Medications Ordered in the ED - No data to display    {Click here for ABCD2, HEART and other calculators REFRESH Note before signing:1}                              Medical Decision Making  ***  {Document critical care time when appropriate  Document review of labs and clinical decision tools ie CHADS2VASC2, etc  Document your independent review of radiology images and any outside records  Document your discussion with family members, caretakers and with consultants  Document social determinants of health affecting pt's care  Document your decision making why or why not admission, treatments were needed:32947:::1}   Final diagnoses:  None    ED Discharge Orders     None        "

## 2024-01-29 ENCOUNTER — Observation Stay (HOSPITAL_COMMUNITY): Admitting: Anesthesiology

## 2024-01-29 ENCOUNTER — Encounter (HOSPITAL_COMMUNITY): Admission: EM | Disposition: A | Payer: Self-pay | Source: Home / Self Care | Attending: Emergency Medicine

## 2024-01-29 DIAGNOSIS — K7400 Hepatic fibrosis, unspecified: Secondary | ICD-10-CM

## 2024-01-29 DIAGNOSIS — K8000 Calculus of gallbladder with acute cholecystitis without obstruction: Secondary | ICD-10-CM

## 2024-01-29 HISTORY — PX: LIVER BIOPSY: SHX301

## 2024-01-29 SURGERY — CHOLECYSTECTOMY, ROBOT-ASSISTED, LAPAROSCOPIC
Anesthesia: General | Site: Abdomen

## 2024-01-29 MED ORDER — LIDOCAINE 2% (20 MG/ML) 5 ML SYRINGE
INTRAMUSCULAR | Status: AC
  Filled 2024-01-29: qty 5

## 2024-01-29 MED ORDER — LIDOCAINE 2% (20 MG/ML) 5 ML SYRINGE
INTRAMUSCULAR | Status: DC | PRN
  Administered 2024-01-29: 60 mg via INTRAVENOUS

## 2024-01-29 MED ORDER — ATENOLOL 25 MG PO TABS
100.0000 mg | ORAL_TABLET | Freq: Every day | ORAL | Status: DC
  Administered 2024-01-29: 100 mg via ORAL
  Filled 2024-01-29: qty 4

## 2024-01-29 MED ORDER — INDOCYANINE GREEN 25 MG IJ SOLR
INTRAMUSCULAR | Status: AC
  Administered 2024-01-29: 2.5 mg via INTRAVENOUS
  Filled 2024-01-29: qty 10

## 2024-01-29 MED ORDER — ESTRADIOL 2 MG PO TABS
2.0000 mg | ORAL_TABLET | Freq: Every day | ORAL | Status: DC
  Administered 2024-01-29: 2 mg via ORAL
  Filled 2024-01-29: qty 1

## 2024-01-29 MED ORDER — ROCURONIUM BROMIDE 10 MG/ML (PF) SYRINGE
PREFILLED_SYRINGE | INTRAVENOUS | Status: DC | PRN
  Administered 2024-01-29: 60 mg via INTRAVENOUS

## 2024-01-29 MED ORDER — SUGAMMADEX SODIUM 200 MG/2ML IV SOLN
INTRAVENOUS | Status: DC | PRN
  Administered 2024-01-29: 200 mg via INTRAVENOUS

## 2024-01-29 MED ORDER — ONDANSETRON HCL 4 MG/2ML IJ SOLN
INTRAMUSCULAR | Status: DC | PRN
  Administered 2024-01-29: 4 mg via INTRAVENOUS

## 2024-01-29 MED ORDER — HYDROMORPHONE HCL 1 MG/ML IJ SOLN
1.0000 mg | INTRAMUSCULAR | Status: DC | PRN
  Administered 2024-01-29 – 2024-01-30 (×2): 1 mg via INTRAVENOUS
  Filled 2024-01-29 (×2): qty 1

## 2024-01-29 MED ORDER — OXYCODONE HCL 5 MG PO TABS: 5.0000 mg | ORAL_TABLET | Freq: Once | ORAL | Status: DC | PRN

## 2024-01-29 MED ORDER — LACTATED RINGERS IV SOLN: INTRAVENOUS | Status: DC

## 2024-01-29 MED ORDER — FENTANYL CITRATE (PF) 100 MCG/2ML IJ SOLN
INTRAMUSCULAR | Status: DC | PRN
  Administered 2024-01-29 (×2): 50 ug via INTRAVENOUS

## 2024-01-29 MED ORDER — ACETAMINOPHEN 10 MG/ML IV SOLN
1000.0000 mg | Freq: Once | INTRAVENOUS | Status: AC
  Administered 2024-01-29: 1000 mg via INTRAVENOUS

## 2024-01-29 MED ORDER — FENTANYL CITRATE (PF) 50 MCG/ML IJ SOSY
25.0000 ug | PREFILLED_SYRINGE | INTRAMUSCULAR | Status: DC | PRN
  Administered 2024-01-29: 50 ug via INTRAVENOUS
  Filled 2024-01-29: qty 1

## 2024-01-29 MED ORDER — ONDANSETRON HCL 4 MG/2ML IJ SOLN
4.0000 mg | Freq: Once | INTRAMUSCULAR | Status: AC | PRN
  Administered 2024-01-29: 4 mg via INTRAVENOUS
  Filled 2024-01-29: qty 2

## 2024-01-29 MED ORDER — PROPOFOL 10 MG/ML IV BOLUS
INTRAVENOUS | Status: DC | PRN
  Administered 2024-01-29: 140 mg via INTRAVENOUS

## 2024-01-29 MED ORDER — BENAZEPRIL HCL 20 MG PO TABS
20.0000 mg | ORAL_TABLET | Freq: Every day | ORAL | Status: DC
  Administered 2024-01-29: 20 mg via ORAL
  Filled 2024-01-29: qty 1

## 2024-01-29 MED ORDER — ORAL CARE MOUTH RINSE: 15.0000 mL | Freq: Once | OROMUCOSAL | Status: AC

## 2024-01-29 MED ORDER — ORAL CARE MOUTH RINSE: 15.0000 mL | Freq: Once | OROMUCOSAL | Status: DC

## 2024-01-29 MED ORDER — DIPHENHYDRAMINE HCL 50 MG/ML IJ SOLN
INTRAMUSCULAR | Status: DC | PRN
  Administered 2024-01-29: 12.5 mg via INTRAVENOUS

## 2024-01-29 MED ORDER — HEMOSTATIC AGENTS (NO CHARGE) OPTIME
TOPICAL | Status: DC | PRN
  Administered 2024-01-29: 1

## 2024-01-29 MED ORDER — OXYCODONE HCL 5 MG/5ML PO SOLN: 5.0000 mg | Freq: Once | ORAL | Status: DC | PRN

## 2024-01-29 MED ORDER — PROPOFOL 500 MG/50ML IV EMUL
INTRAVENOUS | Status: DC | PRN
  Administered 2024-01-29: 125 ug/kg/min via INTRAVENOUS

## 2024-01-29 MED ORDER — ACETAMINOPHEN 10 MG/ML IV SOLN
INTRAVENOUS | Status: AC
  Filled 2024-01-29: qty 100

## 2024-01-29 MED ORDER — TRAMADOL HCL 50 MG PO TABS: 50.0000 mg | ORAL_TABLET | Freq: Four times a day (QID) | ORAL | 0 refills | Status: DC | PRN

## 2024-01-29 MED ORDER — ONDANSETRON HCL 4 MG/2ML IJ SOLN
INTRAMUSCULAR | Status: AC
  Filled 2024-01-29: qty 2

## 2024-01-29 MED ORDER — DIPHENHYDRAMINE HCL 50 MG/ML IJ SOLN
INTRAMUSCULAR | Status: AC
  Filled 2024-01-29: qty 1

## 2024-01-29 MED ORDER — PROPOFOL 10 MG/ML IV BOLUS
INTRAVENOUS | Status: AC
  Filled 2024-01-29: qty 20

## 2024-01-29 MED ORDER — BUPIVACAINE HCL (PF) 0.5 % IJ SOLN
INTRAMUSCULAR | Status: AC
  Filled 2024-01-29: qty 30

## 2024-01-29 MED ORDER — SODIUM CHLORIDE 0.9 % IR SOLN
Status: DC | PRN
  Administered 2024-01-29: 3000 mL

## 2024-01-29 MED ORDER — STERILE WATER FOR IRRIGATION IR SOLN
Status: DC | PRN
  Administered 2024-01-29: 1000 mL

## 2024-01-29 MED ORDER — CHLORHEXIDINE GLUCONATE 0.12 % MT SOLN: 15.0000 mL | Freq: Once | OROMUCOSAL | Status: DC

## 2024-01-29 MED ORDER — CHLORHEXIDINE GLUCONATE 0.12 % MT SOLN
OROMUCOSAL | Status: AC
  Administered 2024-01-29: 15 mL via OROMUCOSAL
  Filled 2024-01-29: qty 15

## 2024-01-29 MED ORDER — FENTANYL CITRATE (PF) 100 MCG/2ML IJ SOLN
INTRAMUSCULAR | Status: AC
  Filled 2024-01-29: qty 2

## 2024-01-29 MED ORDER — ROCURONIUM BROMIDE 10 MG/ML (PF) SYRINGE
PREFILLED_SYRINGE | INTRAVENOUS | Status: AC
  Filled 2024-01-29: qty 10

## 2024-01-29 MED ORDER — ONDANSETRON 4 MG PO TBDP: 4.0000 mg | ORAL_TABLET | Freq: Three times a day (TID) | ORAL | 0 refills | Status: AC | PRN

## 2024-01-29 MED ORDER — CHLORHEXIDINE GLUCONATE 0.12 % MT SOLN: 15.0000 mL | Freq: Once | OROMUCOSAL | Status: AC

## 2024-01-29 MED ORDER — DEXAMETHASONE SOD PHOSPHATE PF 10 MG/ML IJ SOLN
INTRAMUSCULAR | Status: DC | PRN
  Administered 2024-01-29: 6 mg via INTRAVENOUS

## 2024-01-29 MED ORDER — SUGAMMADEX SODIUM 200 MG/2ML IV SOLN
INTRAVENOUS | Status: AC
  Filled 2024-01-29: qty 2

## 2024-01-29 MED ORDER — AMLODIPINE BESYLATE 5 MG PO TABS
10.0000 mg | ORAL_TABLET | Freq: Every day | ORAL | Status: DC
  Administered 2024-01-29: 10 mg via ORAL
  Filled 2024-01-29: qty 2

## 2024-01-29 SURGICAL SUPPLY — 40 items
CAUTERY HOOK MNPLR 1.6 DVNC XI (INSTRUMENTS) ×1 IMPLANT
CHLORAPREP W/TINT 26 (MISCELLANEOUS) ×1 IMPLANT
CLIP LIGATING HEM O LOK PURPLE (MISCELLANEOUS) ×1 IMPLANT
COVER LIGHT HANDLE (MISCELLANEOUS) IMPLANT
DERMABOND ADVANCED .7 DNX12 (GAUZE/BANDAGES/DRESSINGS) ×1 IMPLANT
DRAPE ARM DVNC X/XI (DISPOSABLE) ×4 IMPLANT
DRAPE COLUMN DVNC XI (DISPOSABLE) ×1 IMPLANT
DRSG TELFA 3X8 NADH STRL (GAUZE/BANDAGES/DRESSINGS) IMPLANT
ELECTRODE REM PT RTRN 9FT ADLT (ELECTROSURGICAL) ×1 IMPLANT
FORCEPS BPLR R/ABLATION 8 DVNC (INSTRUMENTS) ×1 IMPLANT
FORCEPS PROGRASP DVNC XI (FORCEP) ×1 IMPLANT
GLOVE BIOGEL PI IND STRL 7.0 (GLOVE) ×2 IMPLANT
GLOVE SURG SS PI 7.5 STRL IVOR (GLOVE) ×2 IMPLANT
GOWN STRL REUS W/TWL LRG LVL3 (GOWN DISPOSABLE) ×3 IMPLANT
HEMOSTAT SNOW SURGICEL 2X4 (HEMOSTASIS) IMPLANT
IRRIGATOR SUCT 8 DISP DVNC XI (IRRIGATION / IRRIGATOR) IMPLANT
KIT TURNOVER KIT A (KITS) ×1 IMPLANT
MANIFOLD NEPTUNE II (INSTRUMENTS) ×1 IMPLANT
NDL BIOPSY 14GX4.5 SOFT TIS (NEEDLE) IMPLANT
NDL HYPO 21X1.5 SAFETY (NEEDLE) ×1 IMPLANT
NDL INSUFFLATION 14GA 120MM (NEEDLE) ×1 IMPLANT
NEEDLE BIOPSY 14GX4.5 SOFT TIS (NEEDLE) IMPLANT
NEEDLE HYPO 21X1.5 SAFETY (NEEDLE) ×1 IMPLANT
NEEDLE INSUFFLATION 14GA 120MM (NEEDLE) ×1 IMPLANT
OBTURATOR OPTICALSTD 8 DVNC (TROCAR) ×1 IMPLANT
PACK LAP CHOLE LZT030E (CUSTOM PROCEDURE TRAY) ×1 IMPLANT
PAD ARMBOARD POSITIONER FOAM (MISCELLANEOUS) ×1 IMPLANT
PENCIL HANDSWITCHING (ELECTRODE) ×1 IMPLANT
POSITIONER HEAD 8X9X4 ADT (SOFTGOODS) ×1 IMPLANT
RELOAD STAPLE 30 2.5 WHT DVNC (STAPLE) IMPLANT
SEAL UNIV 5-12 XI (MISCELLANEOUS) ×4 IMPLANT
SET BASIN LINEN APH (SET/KITS/TRAYS/PACK) ×1 IMPLANT
SET TUBE SMOKE EVAC HIGH FLOW (TUBING) IMPLANT
SOL .9 NS 3000ML IRR UROMATIC (IV SOLUTION) IMPLANT
STAPLER 30 CRVD 8 SUREFORM (STAPLE) IMPLANT
SUT MNCRL AB 4-0 PS2 18 (SUTURE) ×2 IMPLANT
SUT VICRYL 0 UR6 27IN ABS (SUTURE) IMPLANT
SYR 30ML LL (SYRINGE) ×1 IMPLANT
SYSTEM RETRIEVL 5MM INZII UNIV (BASKET) ×1 IMPLANT
WATER STERILE IRR 500ML POUR (IV SOLUTION) ×1 IMPLANT

## 2024-01-29 NOTE — Transfer of Care (Signed)
 Immediate Anesthesia Transfer of Care Note  Patient: Samantha Hamilton  Procedure(s) Performed: CHOLECYSTECTOMY, ROBOT-ASSISTED, LAPAROSCOPIC (Abdomen) BIOPSY, LIVER (Abdomen)  Patient Location: PACU  Anesthesia Type:General  Level of Consciousness: awake, alert , oriented, and patient cooperative  Airway & Oxygen Therapy: Patient Spontanous Breathing and Patient connected to face mask oxygen  Post-op Assessment: Report given to RN, Post -op Vital signs reviewed and stable, and Patient moving all extremities X 4  Post vital signs: Reviewed and stable  Last Vitals:  Vitals Value Taken Time  BP 94/81 0952  Temp 97.5 0953  Pulse 63 01/29/24 09:52  Resp 14 01/29/24 09:52  SpO2 100 % 01/29/24 09:52  Vitals shown include unfiled device data.  Last Pain:  Vitals:   01/29/24 0716  TempSrc: Oral  PainSc: 6       Patients Stated Pain Goal: 7 (01/29/24 0716)  Complications: No notable events documented.

## 2024-01-29 NOTE — Anesthesia Preprocedure Evaluation (Signed)
"                                    Anesthesia Evaluation  Patient identified by MRN, date of birth, ID band Patient awake    Reviewed: Allergy & Precautions, H&P , NPO status , Patient's Chart, lab work & pertinent test results, reviewed documented beta blocker date and time   History of Anesthesia Complications (+) PONV and history of anesthetic complications  Airway Mallampati: II  TM Distance: >3 FB Neck ROM: full    Dental no notable dental hx.    Pulmonary neg pulmonary ROS   Pulmonary exam normal breath sounds clear to auscultation       Cardiovascular Exercise Tolerance: Good hypertension,  Rhythm:regular Rate:Normal     Neuro/Psych negative neurological ROS  negative psych ROS   GI/Hepatic negative GI ROS, Neg liver ROS,,,  Endo/Other  Hypothyroidism    Renal/GU negative Renal ROS  negative genitourinary   Musculoskeletal   Abdominal   Peds  Hematology negative hematology ROS (+)   Anesthesia Other Findings   Reproductive/Obstetrics negative OB ROS                              Anesthesia Physical Anesthesia Plan  ASA: 2  Anesthesia Plan: General and General ETT   Post-op Pain Management:    Induction:   PONV Risk Score and Plan: Ondansetron  and Scopolamine patch - Pre-op  Airway Management Planned:   Additional Equipment:   Intra-op Plan:   Post-operative Plan:   Informed Consent: I have reviewed the patients History and Physical, chart, labs and discussed the procedure including the risks, benefits and alternatives for the proposed anesthesia with the patient or authorized representative who has indicated his/her understanding and acceptance.     Dental Advisory Given  Plan Discussed with: CRNA  Anesthesia Plan Comments:         Anesthesia Quick Evaluation  "

## 2024-01-29 NOTE — Plan of Care (Signed)

## 2024-01-29 NOTE — Op Note (Signed)
 Patient:  Samantha Hamilton  DOB:  1950/05/07  MRN:  [id number]   Preop Diagnosis: Acute cholecystitis, cholelithiasis, liver fibrosis  Postop Diagnosis: Same  Procedure: Robotic assisted laparoscopic cholecystectomy, liver biopsy  Surgeon: Oneil Budge, MD  Anes: General Endotracheal  Indications: Patient is a 73 year old white female who presents with acute cholecystitis secondary to cholelithiasis.  She also has history of liver fibrosis.  The risks and benefits of the procedure including bleeding, infection, hepatobiliary injury, and the possibility of an open procedure were fully explained to the patient, who gave informed consent.  Procedure note: The patient was placed in the supine position.  After induction of general endotracheal anesthesia, the abdomen was prepped and draped using the usual sterile technique with ChloraPrep.  Surgical site confirmation was performed.  An infraumbilical incision was made down to the fascia.  A Veress needle was introduced into the abdominal cavity and confirmation of placement was done using the saline drop test.  The abdomen was then insufflated to 15 mmHg pressure.  An 8 mm trocar was introduced into the abdominal cavity under direct visualization without difficulty.  The patient was placed in reverse Trendelenburg position and additional 8 mm trocars were placed in the left upper quadrant, right lower quadrant, and right flank regions.  The liver was inspected and appeared to be within normal limits for the patient's age.  A Tru-Cut core biopsy of the right lobe of the liver was performed and sent to pathology for further examination.  The biopsy site was cauterized using Bovie electrocautery.  The robot was then docked and targeted.  The gallbladder was inflamed and a little bit distended.  This was consistent with her acute cholecystitis.  The gallbladder was retracted in a dynamic fashion in order to provide a critical view of the triangle of Calot.   The cystic duct was first identified.  Its junction to the infundibulum was fully identified.  This was confirmed by firefly.  Both a hemologic clip and a sure fire white load Endo GIA was placed across the cystic duct and fired.  The cystic artery was ligated using a Hem-o-lok clip.  The gallbladder was then freed away from the gallbladder fossa using Bovie electrocautery.  The gallbladder was delivered through the left upper quadrant trocar site using an Endo Catch bag without difficulty.  The gallbladder fossa was inspected and a bleeding was controlled using Bovie electrocautery.  No bile leakage was noted.  The right upper quadrant was copiously irrigated with normal saline.  Surgicel snow was placed in the gallbladder fossa.  All fluid and air were then evacuated from the abdominal cavity once the robot was undocked.  All trocars were then removed.  All wounds were irrigated with normal saline.  All wounds were injected with 0.5% Sensorcaine .  The left upper quadrant fascia was reapproximated using an 0 Vicryl interrupted suture.  All skin incisions were closed using a 4-0 Monocryl subcuticular suture.  Dermabond was applied.  All tape and needle counts were correct at the end of the procedure.  The patient was extubated in the operating room and transferred to PACU in stable condition.  Complications:  None  EBL:  minimal  Specimen:  gallbladder, liver biopsy

## 2024-01-29 NOTE — Interval H&P Note (Signed)
 History and Physical Interval Note:  01/29/2024 7:58 AM  Samantha Hamilton  has presented today for surgery, with the diagnosis of cholelithiasis.  The various methods of treatment have been discussed with the patient and family. After consideration of risks, benefits and other options for treatment, the patient has consented to  Procedures: CHOLECYSTECTOMY, ROBOT-ASSISTED, LAPAROSCOPIC (N/A) as a surgical intervention.  The patient's history has been reviewed, patient examined, no change in status, stable for surgery.  I have reviewed the patient's chart and labs.  Questions were answered to the patient's satisfaction.     Oneil Budge

## 2024-01-29 NOTE — Anesthesia Procedure Notes (Signed)
 Procedure Name: Intubation Date/Time: 01/29/2024 8:18 AM  Performed by: Cordella Elvie HERO, CRNAPre-anesthesia Checklist: Patient identified, Emergency Drugs available, Suction available, Patient being monitored and Timeout performed Patient Re-evaluated:Patient Re-evaluated prior to induction Oxygen Delivery Method: Circle system utilized Preoxygenation: Pre-oxygenation with 100% oxygen Induction Type: IV induction Ventilation: Mask ventilation without difficulty Laryngoscope Size: Mac and 3 Grade View: Grade II Tube type: Oral Tube size: 7.0 mm Number of attempts: 1 Airway Equipment and Method: Stylet Placement Confirmation: ETT inserted through vocal cords under direct vision, positive ETCO2, CO2 detector and breath sounds checked- equal and bilateral Secured at: 22 cm Tube secured with: Tape Dental Injury: Teeth and Oropharynx as per pre-operative assessment

## 2024-01-29 NOTE — Care Management Obs Status (Signed)
 MEDICARE OBSERVATION STATUS NOTIFICATION   Patient Details  Name: Samantha Hamilton MRN: [id number] Date of Birth: [birth year]   Medicare Observation Status Notification Given:  Yes    Sharlyne Stabs, RN 01/29/2024, 4:01 PM

## 2024-01-29 NOTE — Progress Notes (Signed)
" ° °  Inpatient Care Manager [facility]) has reviewed patient and no ICM needs have been identified at this time. We will continue to monitor patient advancement through interdisciplinary progression rounds. If new patient transition needs arise, please place a ICM consult.    01/29/24 0829  TOC Brief Assessment  Insurance and Status Reviewed  Patient has primary care physician Yes  Home environment has been reviewed Home  Prior level of function: Independent  Prior/Current Home Services No current home services  Social Drivers of Health Review SDOH reviewed no interventions necessary  Readmission risk has been reviewed Yes  Transition of care needs no transition of care needs at this time   Going to OR "

## 2024-01-29 NOTE — Progress Notes (Signed)
 Patient vomiting liquid substance after receiving PRN pain med, Dilaudid . Admin PRN Zofran  and changed patients gown. Will continue to monitor. No other c/o pain.

## 2024-01-29 NOTE — Anesthesia Postprocedure Evaluation (Signed)
"   Anesthesia Post Note  Patient: Samantha Hamilton  Procedure(s) Performed: CHOLECYSTECTOMY, ROBOT-ASSISTED, LAPAROSCOPIC (Abdomen) BIOPSY, LIVER (Abdomen)  Patient location during evaluation: Phase II Anesthesia Type: General Level of consciousness: awake Pain management: pain level controlled Vital Signs Assessment: post-procedure vital signs reviewed and stable Respiratory status: spontaneous breathing and respiratory function stable Cardiovascular status: blood pressure returned to baseline and stable Postop Assessment: no headache and no apparent nausea or vomiting Anesthetic complications: no Comments: Late entry   No notable events documented.   Last Vitals:  Vitals:   01/29/24 1100 01/29/24 1140  BP: 129/67 (!) 143/53  Pulse: 63 62  Resp: 13 16  Temp: 36.6 C 36.5 C  SpO2: 97% 94%    Last Pain:  Vitals:   01/29/24 1140  TempSrc: Oral  PainSc: 0-No pain                 Yvonna PARAS Nataliya Graig      "

## 2024-01-30 ENCOUNTER — Encounter (HOSPITAL_COMMUNITY): Payer: Self-pay | Admitting: General Surgery

## 2024-01-30 LAB — CBC
HCT: 32.9 % — ABNORMAL LOW (ref 36.0–46.0)
Hemoglobin: 11.4 g/dL — ABNORMAL LOW (ref 12.0–15.0)
MCH: 31.8 pg (ref 26.0–34.0)
MCHC: 34.7 g/dL (ref 30.0–36.0)
MCV: 91.6 fL (ref 80.0–100.0)
Platelets: 288 K/uL (ref 150–400)
RBC: 3.59 MIL/uL — ABNORMAL LOW (ref 3.87–5.11)
RDW: 11.9 % (ref 11.5–15.5)
WBC: 10.3 K/uL (ref 4.0–10.5)
nRBC: 0 % (ref 0.0–0.2)

## 2024-01-30 LAB — COMPREHENSIVE METABOLIC PANEL WITH GFR
ALT: 133 U/L — ABNORMAL HIGH (ref 0–44)
AST: 199 U/L — ABNORMAL HIGH (ref 15–41)
Albumin: 3.4 g/dL — ABNORMAL LOW (ref 3.5–5.0)
Alkaline Phosphatase: 56 U/L (ref 38–126)
Anion gap: 5 (ref 5–15)
BUN: 11 mg/dL (ref 8–23)
CO2: 30 mmol/L (ref 22–32)
Calcium: 8.5 mg/dL — ABNORMAL LOW (ref 8.9–10.3)
Chloride: 103 mmol/L (ref 98–111)
Creatinine, Ser: 0.73 mg/dL (ref 0.44–1.00)
GFR, Estimated: >60 mL/min
Glucose, Bld: 116 mg/dL — ABNORMAL HIGH (ref 70–99)
Potassium: 4.1 mmol/L (ref 3.5–5.1)
Sodium: 138 mmol/L (ref 135–145)
Total Bilirubin: 0.3 mg/dL (ref 0.0–1.2)
Total Protein: 6.1 g/dL — ABNORMAL LOW (ref 6.5–8.1)

## 2024-01-30 MED ORDER — OXYCODONE HCL 5 MG PO TABS: 5.0000 mg | ORAL_TABLET | ORAL | 0 refills | Status: AC | PRN

## 2024-01-30 NOTE — Discharge Summary (Signed)
 Physician Discharge Summary  Patient ID: CARLENE BICKLEY MRN: [id number] DOB/AGE: 10-02-1950 73 y.o.  Admit date: 01/28/2024 Discharge date: 01/30/2024  Admission Diagnoses: Acute cholecystitis, cholelithiasis  Discharge Diagnoses:  Principal Problem:   Cholecystitis with cholelithiasis Active Problems:   Liver fibrosis Hypothyroidism, hypertension  Discharged Condition: good  Hospital Course: Patient is a 73 year old white female who presented to the emergency room on 01/28/2024 with worsening right upper quadrant abdominal pain.  She had known history of cholelithiasis.  She also has a history of liver fibrosis.  She was admitted to the hospital on 01/28/2024 for pain control, IV fluids, and IV Rocephin .  She subsequently underwent a robotic assisted laparoscopic cholecystectomy with liver biopsy on 01/29/2024.  She tolerated the surgery well.  Her postoperative course has been unremarkable.  Her diet was advanced difficulty.  Her incisional pain eased throughout the day.  Her postoperative liver enzyme test are elevated which is not unexpected given that she just had surgery.  Hemoglobin is decreased but stable.  She is being discharged home on 01/30/2024 in good and improving condition.  Treatments: surgery: Robotic assisted laparoscopic cholecystectomy, liver biopsy on 01/29/2024  Discharge Exam: Blood pressure (!) 121/52, pulse 67, temperature 98.8 F (37.1 C), temperature source Oral, resp. rate 18, height 5' 2 (1.575 m), weight 68 kg, SpO2 94%. General appearance: alert, cooperative, and no distress Resp: clear to auscultation bilaterally Cardio: regular rate and rhythm, S1, S2 normal, no murmur, click, rub or gallop GI: Soft, incisions healing well.  No distention or rigidity noted.  Disposition: Discharge disposition: 01-Home or Self Care       Discharge Instructions     Increase activity slowly   Complete by: As directed    Increase activity slowly   Complete by:  As directed       Allergies as of 01/30/2024       Reactions   Codeine Nausea Only   Shellfish Allergy Anaphylaxis   Metoprolol Tartrate    INCREASED LFT INTOLERANCE  MEDICATION CHANGED  LRT BACK TO NORMAL   Other    Other Reaction(s): Liver Disorder INCREASED LFT  INTOLERANCE  MEDICATION CHANGED   LRT BACK TO NORMAL   Transderm-scop [scopolamine] Nausea And Vomiting   X SEVERAL DAYS        Medication List     TAKE these medications    amLODipine -benazepril  10-20 MG capsule Commonly known as: LOTREL Take 1 capsule by mouth daily.   atenolol  100 MG tablet Commonly known as: TENORMIN  Take 100 mg by mouth daily.   calcium-vitamin D 500-200 MG-UNIT tablet Commonly known as: OSCAL WITH D Take 1 tablet by mouth 2 (two) times daily.   estradiol  2 MG tablet Commonly known as: ESTRACE  Take 2 mg by mouth daily.   ferrous sulfate 325 (65 FE) MG tablet Take by mouth.   Krill Oil 1000 MG Caps Take by mouth.   loperamide 2 MG tablet Commonly known as: IMODIUM A-D Take 2 mg by mouth at bedtime.   loratadine 10 MG tablet Commonly known as: CLARITIN Take by mouth.   ondansetron  4 MG disintegrating tablet Commonly known as: ZOFRAN -ODT Take 1 tablet (4 mg total) by mouth every 8 (eight) hours as needed for nausea or vomiting.   oxyCODONE  5 MG immediate release tablet Commonly known as: Roxicodone  Take 1 tablet (5 mg total) by mouth every 4 (four) hours as needed.   spironolactone  25 MG tablet Commonly known as: ALDACTONE  Take 25 mg by mouth 2 (two) times daily.  Synthroid  88 MCG tablet Generic drug: levothyroxine  Take by mouth.   triamcinolone 0.1 % paste Commonly known as: KENALOG   zinc gluconate 50 MG tablet Take 50 mg by mouth daily.        Follow-up Information     Mavis Anes, MD Follow up.   Specialty: General Surgery Why: As needed Contact information: [address] [place] [address] [phone]                  Signed: Anes Mavis 01/30/2024, 8:54 AM

## 2024-02-02 LAB — SURGICAL PATHOLOGY

## 2024-02-04 ENCOUNTER — Telehealth: Payer: Self-pay | Admitting: Internal Medicine

## 2024-02-04 NOTE — Telephone Encounter (Signed)
 Liver biopsy reviewed.  Overall stage II fibrosis.  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  Please let patient know.  We will simply do an ELF assessment in 6 months  Office visit in 6 months.  I note LFTs bumped during hospitalization for cholecystectomy likely related to surgery.  Lets repeat a hepatic function profile in 1 month.  Please let her know.

## 2024-02-05 ENCOUNTER — Other Ambulatory Visit: Payer: Self-pay

## 2024-02-05 ENCOUNTER — Telehealth: Payer: Self-pay | Admitting: General Surgery

## 2024-02-05 DIAGNOSIS — K760 Fatty (change of) liver, not elsewhere classified: Secondary | ICD-10-CM

## 2024-02-05 DIAGNOSIS — Z09 Encounter for follow-up examination after completed treatment for conditions other than malignant neoplasm: Secondary | ICD-10-CM

## 2024-02-05 NOTE — Telephone Encounter (Signed)
 Pt was made aware and verbalized understanding. Labs were ordered and will be mailed to the pt to have done in 1 month and before appt in April.

## 2024-02-05 NOTE — Telephone Encounter (Signed)
 I contacted patient for a virtual telephone postoperative visit.  She states she is doing well.  Her incisions are healing well.  She has already been contacted by Dr. Ivonne office for follow-up of her liver biopsy.  She denies any abdominal pain.  She is pleased with the results.  As this was a part of the total global surgical fee, this was not a billable visit.  Total telephone time was 2-1/2 minutes.

## 2024-02-11 ENCOUNTER — Ambulatory Visit: Admitting: General Surgery

## 2024-02-22 ENCOUNTER — Other Ambulatory Visit: Payer: Self-pay

## 2024-02-22 DIAGNOSIS — K760 Fatty (change of) liver, not elsewhere classified: Secondary | ICD-10-CM

## 2024-04-08 IMAGING — MG MM DIGITAL SCREENING BILAT W/ TOMO AND CAD
8 series · 8 of 24 positions shown · non-contrast
Comparison: Previous exam(s).

CLINICAL DATA: Screening.

EXAM:
DIGITAL SCREENING BILATERAL MAMMOGRAM WITH TOMOSYNTHESIS AND CAD
TECHNIQUE: Bilateral screening digital craniocaudal and mediolateral oblique
mammograms were obtained. Bilateral screening digital breast
tomosynthesis was performed. The images were evaluated with
computer-aided detection.

[R CC synth-2D]
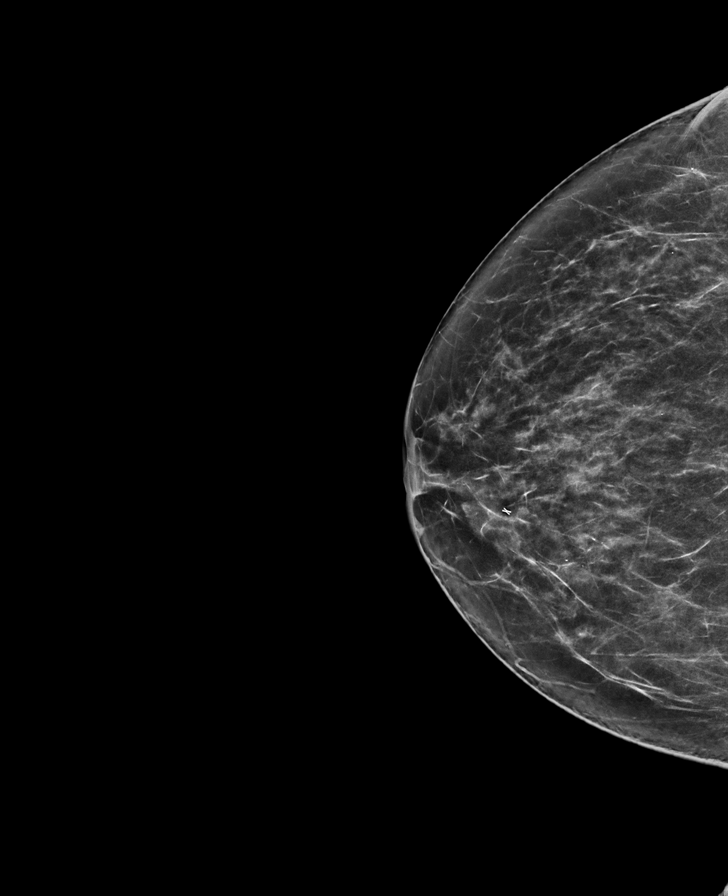

[L CC synth-2D]
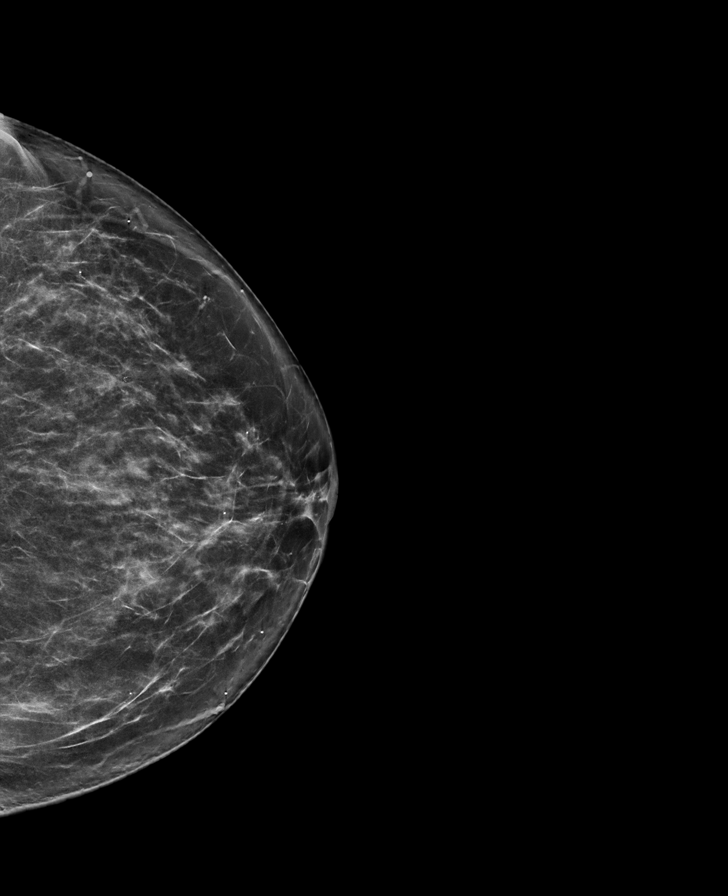

[L MLO synth-2D]
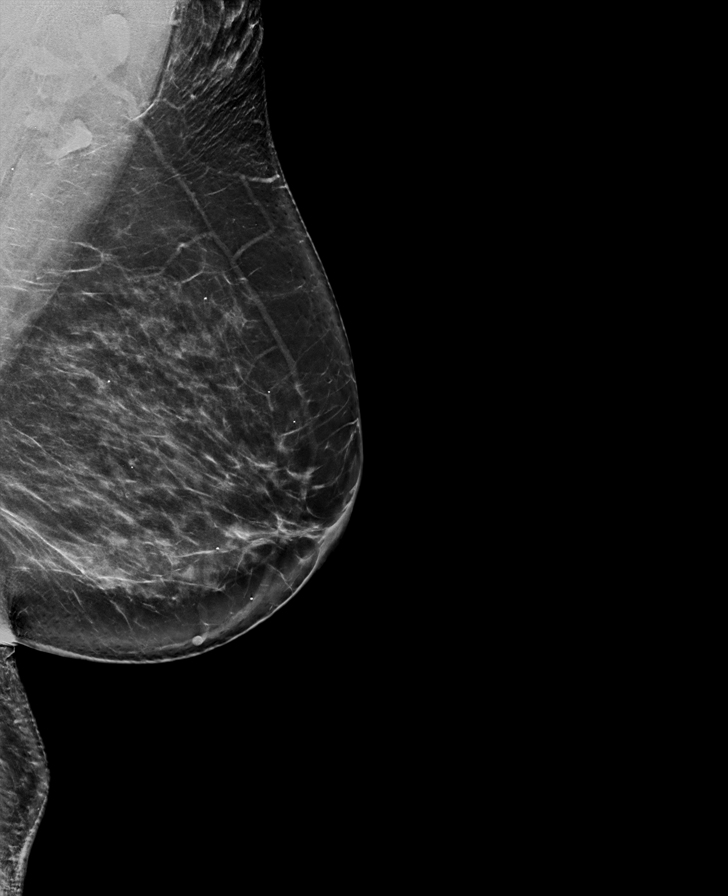

[R MLO synth-2D]
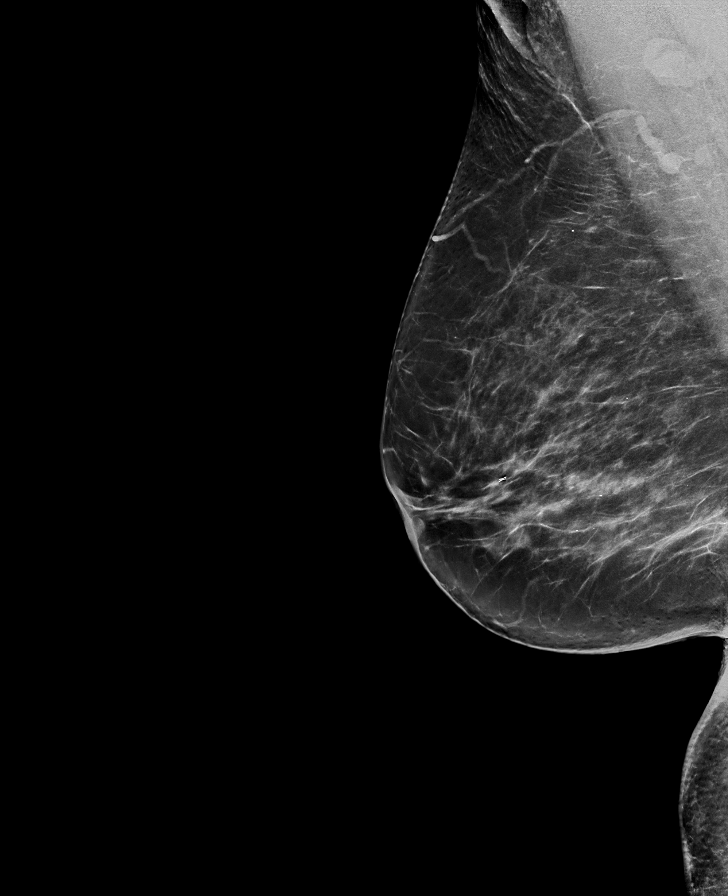

[L CC tomo · tomo slice 37/74.0]
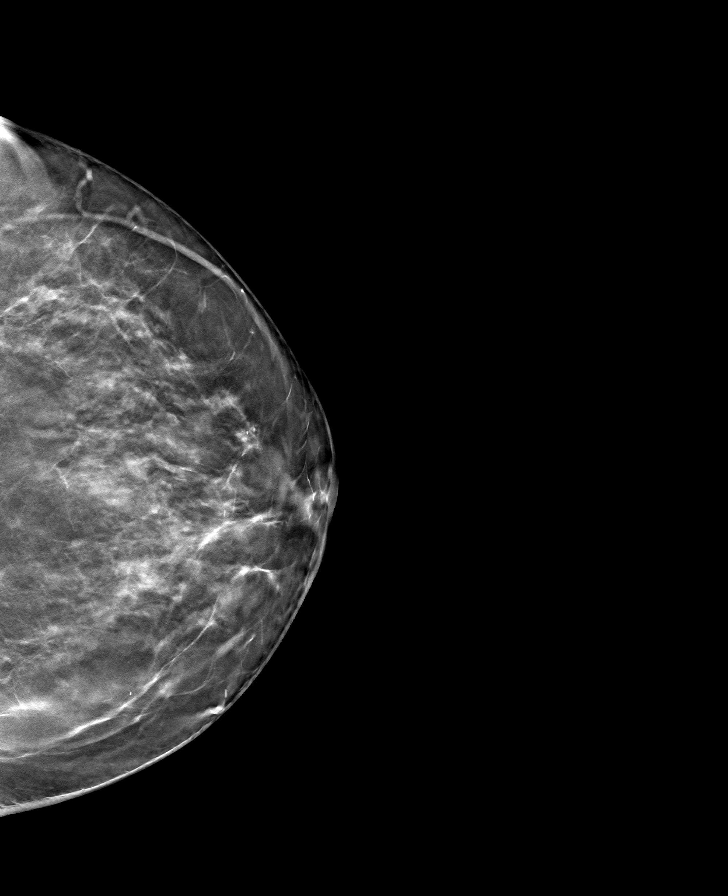

[R CC tomo · tomo slice 37/73.0]
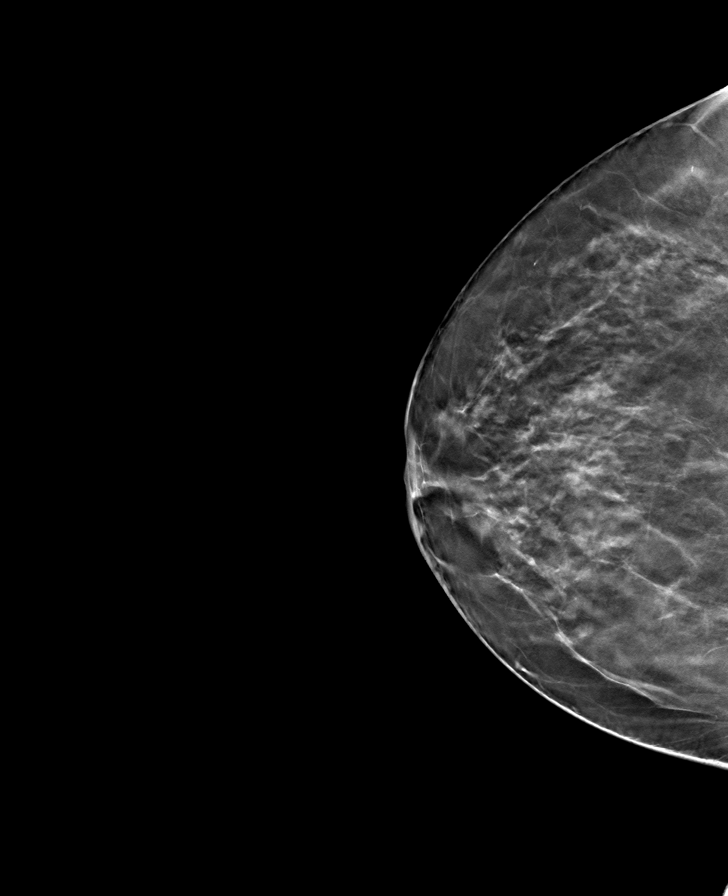

[L MLO tomo · tomo slice 45/88.0]
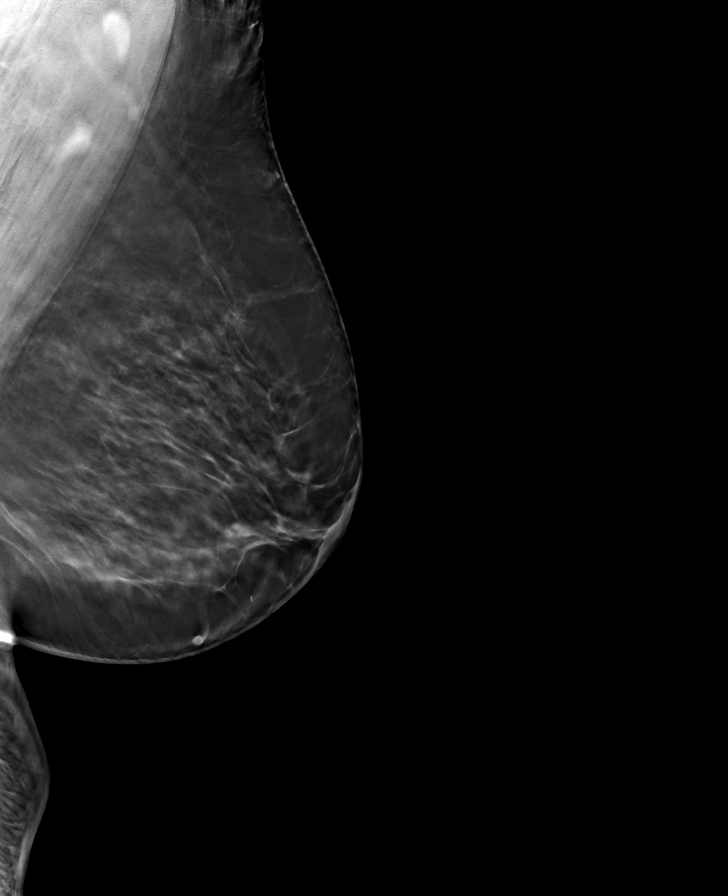

[R MLO tomo · tomo slice 43/85.0]
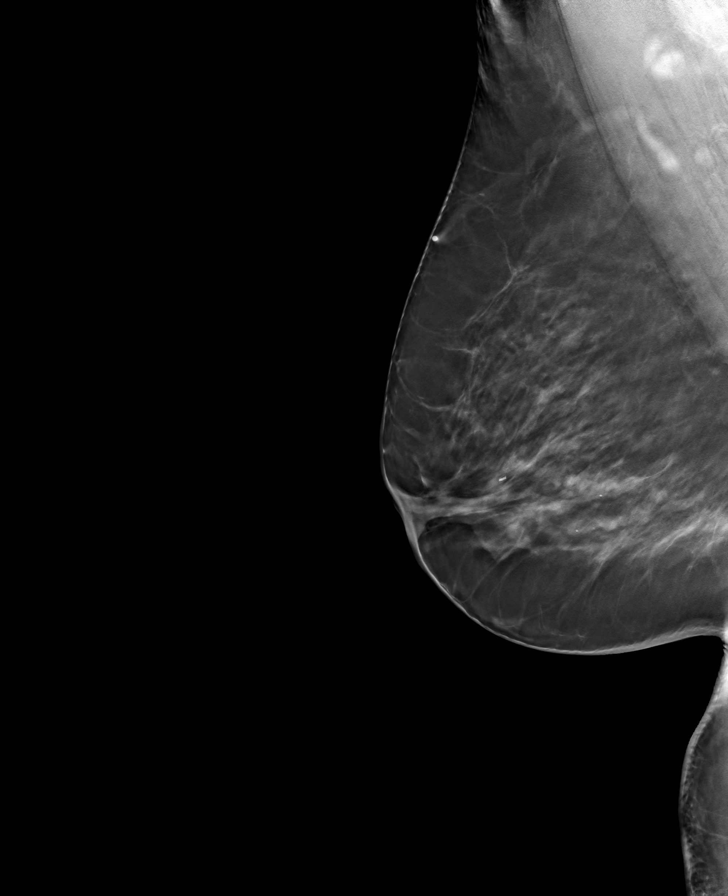

[8 of 24 positions shown; findings below may reference images not displayed]

ACR Breast Density Category c: The breast tissue is heterogeneously
dense, which may obscure small masses.
FINDINGS: There are no findings suspicious for malignancy.
IMPRESSION: No mammographic evidence of malignancy. A result letter of this
screening mammogram will be mailed directly to the patient.

RECOMMENDATION:
Screening mammogram in one year. (Code:Q3-W-BC3)

BI-RADS CATEGORY  1: Negative.
